# Patient Record
Sex: Male | Born: 1975
Health system: Southern US, Community
[De-identification: ages and names within clinical notes are randomized; demographics above are authoritative.]

## PROBLEM LIST (undated history)

## (undated) DIAGNOSIS — E785 Hyperlipidemia, unspecified: Secondary | ICD-10-CM

## (undated) HISTORY — PX: OTHER SURGICAL HISTORY: SHX169

## (undated) HISTORY — DX: Hyperlipidemia, unspecified: E78.5

---

## 2001-03-14 ENCOUNTER — Emergency Department (HOSPITAL_COMMUNITY): Admission: EM | Admit: 2001-03-14 | Discharge: 2001-03-14 | Payer: Self-pay | Admitting: Emergency Medicine

## 2001-03-14 ENCOUNTER — Encounter: Payer: Self-pay | Admitting: Emergency Medicine

## 2001-06-28 ENCOUNTER — Encounter: Payer: Self-pay | Admitting: Internal Medicine

## 2001-06-28 ENCOUNTER — Ambulatory Visit (HOSPITAL_COMMUNITY): Admission: RE | Admit: 2001-06-28 | Discharge: 2001-06-28 | Payer: Self-pay | Admitting: Internal Medicine

## 2003-03-01 ENCOUNTER — Emergency Department (HOSPITAL_COMMUNITY): Admission: EM | Admit: 2003-03-01 | Discharge: 2003-03-01 | Payer: Self-pay | Admitting: Emergency Medicine

## 2005-08-31 ENCOUNTER — Ambulatory Visit (HOSPITAL_COMMUNITY): Admission: RE | Admit: 2005-08-31 | Discharge: 2005-08-31 | Payer: Self-pay | Admitting: Pulmonary Disease

## 2005-09-08 ENCOUNTER — Emergency Department (HOSPITAL_COMMUNITY): Admission: EM | Admit: 2005-09-08 | Discharge: 2005-09-08 | Payer: Self-pay | Admitting: Emergency Medicine

## 2016-08-08 DIAGNOSIS — N200 Calculus of kidney: Secondary | ICD-10-CM | POA: Diagnosis not present

## 2016-08-08 DIAGNOSIS — R300 Dysuria: Secondary | ICD-10-CM | POA: Diagnosis not present

## 2016-08-19 DIAGNOSIS — H15101 Unspecified episcleritis, right eye: Secondary | ICD-10-CM | POA: Diagnosis not present

## 2016-09-14 DIAGNOSIS — R5383 Other fatigue: Secondary | ICD-10-CM | POA: Diagnosis not present

## 2016-09-14 DIAGNOSIS — J309 Allergic rhinitis, unspecified: Secondary | ICD-10-CM | POA: Diagnosis not present

## 2016-12-29 DIAGNOSIS — G8929 Other chronic pain: Secondary | ICD-10-CM | POA: Insufficient documentation

## 2016-12-29 DIAGNOSIS — M25562 Pain in left knee: Secondary | ICD-10-CM | POA: Diagnosis not present

## 2016-12-29 DIAGNOSIS — M1712 Unilateral primary osteoarthritis, left knee: Secondary | ICD-10-CM | POA: Diagnosis not present

## 2017-01-19 ENCOUNTER — Other Ambulatory Visit: Payer: Self-pay | Admitting: Orthopedic Surgery

## 2017-01-19 DIAGNOSIS — M25562 Pain in left knee: Secondary | ICD-10-CM

## 2017-01-19 DIAGNOSIS — Z77018 Contact with and (suspected) exposure to other hazardous metals: Secondary | ICD-10-CM

## 2017-01-23 ENCOUNTER — Other Ambulatory Visit: Payer: Self-pay | Admitting: Orthopedic Surgery

## 2017-01-23 ENCOUNTER — Ambulatory Visit
Admission: RE | Admit: 2017-01-23 | Discharge: 2017-01-23 | Disposition: A | Discharge status: Home / Self Care | Payer: 59 | Source: Ambulatory Visit | Attending: Orthopedic Surgery | Admitting: Orthopedic Surgery

## 2017-01-23 DIAGNOSIS — Z77018 Contact with and (suspected) exposure to other hazardous metals: Secondary | ICD-10-CM

## 2017-01-23 DIAGNOSIS — Z01818 Encounter for other preprocedural examination: Secondary | ICD-10-CM | POA: Diagnosis not present

## 2017-01-24 ENCOUNTER — Ambulatory Visit
Admission: RE | Admit: 2017-01-24 | Discharge: 2017-01-24 | Disposition: A | Discharge status: Home / Self Care | Payer: 59 | Source: Ambulatory Visit | Attending: Orthopedic Surgery | Admitting: Orthopedic Surgery

## 2017-01-24 DIAGNOSIS — M25562 Pain in left knee: Secondary | ICD-10-CM | POA: Diagnosis not present

## 2017-01-24 DIAGNOSIS — S83232A Complex tear of medial meniscus, current injury, left knee, initial encounter: Secondary | ICD-10-CM | POA: Diagnosis not present

## 2017-01-24 DIAGNOSIS — G8929 Other chronic pain: Secondary | ICD-10-CM | POA: Diagnosis not present

## 2017-01-24 DIAGNOSIS — M1711 Unilateral primary osteoarthritis, right knee: Secondary | ICD-10-CM | POA: Diagnosis not present

## 2017-02-07 DIAGNOSIS — N401 Enlarged prostate with lower urinary tract symptoms: Secondary | ICD-10-CM | POA: Diagnosis not present

## 2017-02-07 DIAGNOSIS — J301 Allergic rhinitis due to pollen: Secondary | ICD-10-CM | POA: Diagnosis not present

## 2017-02-08 DIAGNOSIS — S83222A Peripheral tear of medial meniscus, current injury, left knee, initial encounter: Secondary | ICD-10-CM | POA: Diagnosis not present

## 2017-02-08 DIAGNOSIS — M94262 Chondromalacia, left knee: Secondary | ICD-10-CM | POA: Diagnosis not present

## 2017-02-08 DIAGNOSIS — M659 Synovitis and tenosynovitis, unspecified: Secondary | ICD-10-CM | POA: Diagnosis not present

## 2017-02-14 DIAGNOSIS — Z9889 Other specified postprocedural states: Secondary | ICD-10-CM | POA: Insufficient documentation

## 2017-02-14 DIAGNOSIS — G8929 Other chronic pain: Secondary | ICD-10-CM | POA: Diagnosis not present

## 2017-02-14 DIAGNOSIS — M25662 Stiffness of left knee, not elsewhere classified: Secondary | ICD-10-CM | POA: Diagnosis not present

## 2017-02-14 DIAGNOSIS — M25562 Pain in left knee: Secondary | ICD-10-CM | POA: Diagnosis not present

## 2017-02-20 DIAGNOSIS — Z9889 Other specified postprocedural states: Secondary | ICD-10-CM | POA: Diagnosis not present

## 2017-02-20 DIAGNOSIS — M25562 Pain in left knee: Secondary | ICD-10-CM | POA: Diagnosis not present

## 2017-02-20 DIAGNOSIS — M25662 Stiffness of left knee, not elsewhere classified: Secondary | ICD-10-CM | POA: Diagnosis not present

## 2017-08-14 DIAGNOSIS — N401 Enlarged prostate with lower urinary tract symptoms: Secondary | ICD-10-CM | POA: Diagnosis not present

## 2017-08-14 DIAGNOSIS — J301 Allergic rhinitis due to pollen: Secondary | ICD-10-CM | POA: Diagnosis not present

## 2018-02-14 DIAGNOSIS — E785 Hyperlipidemia, unspecified: Secondary | ICD-10-CM | POA: Diagnosis not present

## 2018-02-14 DIAGNOSIS — J301 Allergic rhinitis due to pollen: Secondary | ICD-10-CM | POA: Diagnosis not present

## 2018-02-20 DIAGNOSIS — E785 Hyperlipidemia, unspecified: Secondary | ICD-10-CM | POA: Diagnosis not present

## 2018-02-20 DIAGNOSIS — J301 Allergic rhinitis due to pollen: Secondary | ICD-10-CM | POA: Diagnosis not present

## 2018-03-28 DIAGNOSIS — Z23 Encounter for immunization: Secondary | ICD-10-CM | POA: Diagnosis not present

## 2018-10-02 DIAGNOSIS — M7712 Lateral epicondylitis, left elbow: Secondary | ICD-10-CM | POA: Diagnosis not present

## 2018-10-10 DIAGNOSIS — J301 Allergic rhinitis due to pollen: Secondary | ICD-10-CM | POA: Diagnosis not present

## 2019-01-04 ENCOUNTER — Encounter: Payer: Self-pay | Admitting: Podiatry

## 2019-01-04 ENCOUNTER — Ambulatory Visit: Payer: 59 | Admitting: Podiatry

## 2019-01-04 ENCOUNTER — Other Ambulatory Visit: Payer: Self-pay

## 2019-01-04 ENCOUNTER — Ambulatory Visit (INDEPENDENT_AMBULATORY_CARE_PROVIDER_SITE_OTHER): Payer: 59

## 2019-01-04 VITALS — BP 139/84 | HR 73 | Temp 97.9°F | Resp 16

## 2019-01-04 DIAGNOSIS — M722 Plantar fascial fibromatosis: Secondary | ICD-10-CM

## 2019-01-04 DIAGNOSIS — L600 Ingrowing nail: Secondary | ICD-10-CM

## 2019-01-04 MED ORDER — NEOMYCIN-POLYMYXIN-HC 3.5-10000-1 OT SOLN: OTIC | 1 refills | Status: DC

## 2019-01-04 NOTE — Patient Instructions (Signed)
Soak Instructions    THE DAY AFTER THE PROCEDURE  Place 1/4 cup of epsom salts in a quart of warm tap water.  Submerge your foot or feet with outer bandage intact for the initial soak; this will allow the bandage to become moist and wet for easy lift off.  Once you remove your bandage, continue to soak in the solution for 20 minutes.  This soak should be done twice a day.  Next, remove your foot or feet from solution, blot dry the affected area and cover.  You may use a band aid large enough to cover the area or use gauze and tape.  Apply other medications to the area as directed by the doctor such as polysporin neosporin.  IF YOUR SKIN BECOMES IRRITATED WHILE USING THESE INSTRUCTIONS, IT IS OKAY TO SWITCH TO  WHITE VINEGAR AND WATER. Or you may use antibacterial soap and water to keep the toe clean  Monitor for any signs/symptoms of infection. Call the office immediately if any occur or go directly to the emergency room. Call with any questions/concerns.  [facility] Instructions-Post Nail Surgery  You have had your ingrown toenail and root treated with a chemical.  This chemical causes a burn that will drain and ooze like a blister.  This can drain for 6-8 weeks or longer.  It is important to keep this area clean, covered, and follow the soaking instructions dispensed at the time of your surgery.  This area will eventually dry and form a scab.  Once the scab forms you no longer need to soak or apply a dressing.  If at any time you experience an increase in pain, redness, swelling, or drainage, you should contact the office as soon as possible.   For instructions on how to put on your Plantar Fascial Brace, please visit [email or web address]    Plantar Fasciitis (Heel Spur Syndrome) with Rehab The plantar fascia is a fibrous, ligament-like, soft-tissue structure that spans the bottom of the foot. Plantar fasciitis is a condition that causes pain in the foot due to inflammation of the  tissue. SYMPTOMS   Pain and tenderness on the underneath side of the foot.  Pain that worsens with standing or walking. CAUSES  Plantar fasciitis is caused by irritation and injury to the plantar fascia on the underneath side of the foot. Common mechanisms of injury include:  Direct trauma to bottom of the foot.  Damage to a small nerve that runs under the foot where the main fascia attaches to the heel bone.  Stress placed on the plantar fascia due to bone spurs. RISK INCREASES WITH:   Activities that place stress on the plantar fascia (running, jumping, pivoting, or cutting).  Poor strength and flexibility.  Improperly fitted shoes.  Tight calf muscles.  Flat feet.  Failure to warm-up properly before activity.  Obesity. PREVENTION  Warm up and stretch properly before activity.  Allow for adequate recovery between workouts.  Maintain physical fitness:  Strength, flexibility, and endurance.  Cardiovascular fitness.  Maintain a health body weight.  Avoid stress on the plantar fascia.  Wear properly fitted shoes, including arch supports for individuals who have flat feet.  PROGNOSIS  If treated properly, then the symptoms of plantar fasciitis usually resolve without surgery. However, occasionally surgery is necessary.  RELATED COMPLICATIONS   Recurrent symptoms that may result in a chronic condition.  Problems of the lower back that are caused by compensating for the injury, such as limping.  Pain or weakness of  the foot during push-off following surgery.  Chronic inflammation, scarring, and partial or complete fascia tear, occurring more often from repeated injections.  TREATMENT  Treatment initially involves the use of ice and medication to help reduce pain and inflammation. The use of strengthening and stretching exercises may help reduce pain with activity, especially stretches of the Achilles tendon. These exercises may be performed at home or with a  therapist. Your caregiver may recommend that you use heel cups of arch supports to help reduce stress on the plantar fascia. Occasionally, corticosteroid injections are given to reduce inflammation. If symptoms persist for greater than 6 months despite non-surgical (conservative), then surgery may be recommended.   MEDICATION   If pain medication is necessary, then nonsteroidal anti-inflammatory medications, such as aspirin and ibuprofen, or other minor pain relievers, such as acetaminophen, are often recommended.  Do not take pain medication within 7 days before surgery.  Prescription pain relievers may be given if deemed necessary by your caregiver. Use only as directed and only as much as you need.  Corticosteroid injections may be given by your caregiver. These injections should be reserved for the most serious cases, because they may only be given a certain number of times.  HEAT AND COLD  Cold treatment (icing) relieves pain and reduces inflammation. Cold treatment should be applied for 10 to 15 minutes every 2 to 3 hours for inflammation and pain and immediately after any activity that aggravates your symptoms. Use ice packs or massage the area with a piece of ice (ice massage).  Heat treatment may be used prior to performing the stretching and strengthening activities prescribed by your caregiver, physical therapist, or athletic trainer. Use a heat pack or soak the injury in warm water.  SEEK IMMEDIATE MEDICAL CARE IF:  Treatment seems to offer no benefit, or the condition worsens.  Any medications produce adverse side effects.  EXERCISES- RANGE OF MOTION (ROM) AND STRETCHING EXERCISES - Plantar Fasciitis (Heel Spur Syndrome) These exercises may help you when beginning to rehabilitate your injury. Your symptoms may resolve with or without further involvement from your physician, physical therapist or athletic trainer. While completing these exercises, remember:   Restoring tissue  flexibility helps normal motion to return to the joints. This allows healthier, less painful movement and activity.  An effective stretch should be held for at least 30 seconds.  A stretch should never be painful. You should only feel a gentle lengthening or release in the stretched tissue.  RANGE OF MOTION - Toe Extension, Flexion  Sit with your right / left leg crossed over your opposite knee.  Grasp your toes and gently pull them back toward the top of your foot. You should feel a stretch on the bottom of your toes and/or foot.  Hold this stretch for 10 seconds.  Now, gently pull your toes toward the bottom of your foot. You should feel a stretch on the top of your toes and or foot.  Hold this stretch for 10 seconds. Repeat  times. Complete this stretch 3 times per day.   RANGE OF MOTION - Ankle Dorsiflexion, Active Assisted  Remove shoes and sit on a chair that is preferably not on a carpeted surface.  Place right / left foot under knee. Extend your opposite leg for support.  Keeping your heel down, slide your right / left foot back toward the chair until you feel a stretch at your ankle or calf. If you do not feel a stretch, slide your bottom forward  to the edge of the chair, while still keeping your heel down.  Hold this stretch for 10 seconds. Repeat 3 times. Complete this stretch 2 times per day.   STRETCH  Gastroc, Standing  Place hands on wall.  Extend right / left leg, keeping the front knee somewhat bent.  Slightly point your toes inward on your back foot.  Keeping your right / left heel on the floor and your knee straight, shift your weight toward the wall, not allowing your back to arch.  You should feel a gentle stretch in the right / left calf. Hold this position for 10 seconds. Repeat 3 times. Complete this stretch 2 times per day.  STRETCH  Soleus, Standing  Place hands on wall.  Extend right / left leg, keeping the other knee somewhat bent.   Slightly point your toes inward on your back foot.  Keep your right / left heel on the floor, bend your back knee, and slightly shift your weight over the back leg so that you feel a gentle stretch deep in your back calf.  Hold this position for 10 seconds. Repeat 3 times. Complete this stretch 2 times per day.  STRETCH  Gastrocsoleus, Standing  Note: This exercise can place a lot of stress on your foot and ankle. Please complete this exercise only if specifically instructed by your caregiver.   Place the ball of your right / left foot on a step, keeping your other foot firmly on the same step.  Hold on to the wall or a rail for balance.  Slowly lift your other foot, allowing your body weight to press your heel down over the edge of the step.  You should feel a stretch in your right / left calf.  Hold this position for 10 seconds.  Repeat this exercise with a slight bend in your right / left knee. Repeat 3 times. Complete this stretch 2 times per day.   STRENGTHENING EXERCISES - Plantar Fasciitis (Heel Spur Syndrome)  These exercises may help you when beginning to rehabilitate your injury. They may resolve your symptoms with or without further involvement from your physician, physical therapist or athletic trainer. While completing these exercises, remember:   Muscles can gain both the endurance and the strength needed for everyday activities through controlled exercises.  Complete these exercises as instructed by your physician, physical therapist or athletic trainer. Progress the resistance and repetitions only as guided.  STRENGTH - Towel Curls  Sit in a chair positioned on a non-carpeted surface.  Place your foot on a towel, keeping your heel on the floor.  Pull the towel toward your heel by only curling your toes. Keep your heel on the floor. Repeat 3 times. Complete this exercise 2 times per day.  STRENGTH - Ankle Inversion  Secure one end of a rubber exercise  band/tubing to a fixed object (table, pole). Loop the other end around your foot just before your toes.  Place your fists between your knees. This will focus your strengthening at your ankle.  Slowly, pull your big toe up and in, making sure the band/tubing is positioned to resist the entire motion.  Hold this position for 10 seconds.  Have your muscles resist the band/tubing as it slowly pulls your foot back to the starting position. Repeat 3 times. Complete this exercises 2 times per day.  Document Released: 05/16/2005 Document Revised: 08/08/2011 Document Reviewed: 08/28/2008 [facility] Patient Information 2014 [place], [place].

## 2019-01-04 NOTE — Progress Notes (Signed)
Subjective:   Patient ID: Raymond [name], male   DOB: 43 y.o.   MRN: [id number]   HPI Patient presents stating he has chronic ingrown toenails of both big toes and a lot of pain in the right bottom of his heel of approximate 1 month duration with no history of injury.  States the nails have bothered him for a long time is tried to trim and soak them without relief of symptoms and patient does not smoke and likes to be active   Review of Systems  All other systems reviewed and are negative.       Objective:  Physical Exam Vitals signs and nursing note reviewed.  Constitutional:      Appearance: He is well-developed.  Pulmonary:     Effort: Pulmonary effort is normal.  Musculoskeletal: Normal range of motion.  Skin:    General: Skin is warm.  Neurological:     Mental Status: He is alert.     Neurovascular status found to be intact muscle strength was found to be adequate range of motion within normal limits.  Patient is found to have incurvated lateral borders of the big toes bilateral that are painful with slight redness noted but no active drainage noted.  Patient is found to have good digital perfusion and is well oriented x3 and has exquisite discomfort in the plantar aspect of the right heel at the insertional point tendon into the calcaneus     Assessment:  Chronic ingrown toenail deformity hallux bilateral with structural changes of the nailbed but no indication of infection along with acute plantar fasciitis right     [name]:  H&P condition reviewed and recommended correction of the ingrown toenail deformities.  I explained procedure risk and today I infiltrated each hallux 60 mg Xylocaine Marcaine mixture I remove the lateral borders exposed matrix and applied phenol 3 applications 30 seconds followed by alcohol lavage and sterile dressing.  Gave instructions on soaks and to leave dressings on 24 hours but take them off earlier if throbbing were to occur and wrote  prescription for drops and encouraged to call with questions.  I then did sterile prep and injected the plantar fascial right 3 mg Kenalog 5 mg Xylocaine and instructed on physical therapy shoe gear modification and reappoint 2 weeks to reevaluate  X-rays indicate no spur formation mild depression of the arch with no indication of arthritis stress fracture

## 2019-01-04 NOTE — Progress Notes (Signed)
   Subjective:    Patient ID: Raymond [name], male    DOB: [birth year], 43 y.o.   MRN: [id number]  HPI    Review of Systems  All other systems reviewed and are negative.      Objective:   Physical Exam        Assessment & Plan:

## 2019-01-18 ENCOUNTER — Other Ambulatory Visit: Payer: Self-pay

## 2019-01-18 ENCOUNTER — Ambulatory Visit (INDEPENDENT_AMBULATORY_CARE_PROVIDER_SITE_OTHER): Payer: 59 | Admitting: Podiatry

## 2019-01-18 ENCOUNTER — Encounter: Payer: Self-pay | Admitting: Podiatry

## 2019-01-18 VITALS — Temp 97.4°F

## 2019-01-18 DIAGNOSIS — M722 Plantar fascial fibromatosis: Secondary | ICD-10-CM

## 2019-01-18 DIAGNOSIS — L600 Ingrowing nail: Secondary | ICD-10-CM

## 2019-01-18 MED ORDER — DICLOFENAC SODIUM 75 MG PO TBEC: 75.0000 mg | DELAYED_RELEASE_TABLET | Freq: Two times a day (BID) | ORAL | 2 refills | Status: DC

## 2019-01-18 NOTE — Progress Notes (Signed)
Subjective:   Patient ID: Raymond [name], male   DOB: 43 y.o.   MRN: [id number]   HPI Patient states that he is moderately improved but he still is having some discomfort in the heel right and states the ingrown's are healed well.     ROS      Objective:  Physical Exam  Neurovascular status intact muscle strength was found to be adequate with right heel quite sore in the medial band but still noted but some improvement in the ingrown is healed well at the current time     Assessment:  Plantar fasciitis still present right along with ingrown's that have healed well     [name]:  H&P conditions reviewed at today sterile prep and injected the fascia right 3 mg Kenalog 5 mg Xylocaine and advised on anti-inflammatories supportive shoe.  Patient will be seen back to recheck

## 2019-04-02 IMAGING — MR MR KNEE*L* W/O CM
4 of 5 series · 30 of 40 positions shown · non-contrast
Comparison: None.

CLINICAL DATA: Left knee pain for 2 months.  No known injury.

EXAM:
MRI OF THE LEFT KNEE WITHOUT CONTRAST
TECHNIQUE: Multiplanar, multisequence MR imaging of the knee was performed. No
intravenous contrast was administered.

[Series 6: PD fat-sat · axial · left · 3.0mm · 0.39mm/px · z∈[-28,+93]mm · 9 of 38 slices shown (1 of 3)]
[im 1/38]
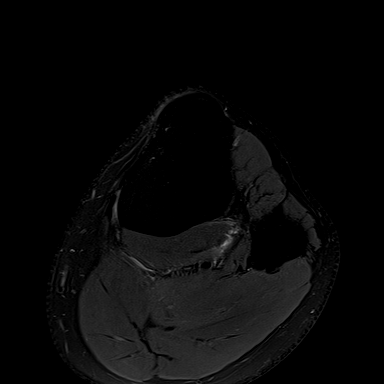
[im 5/38]
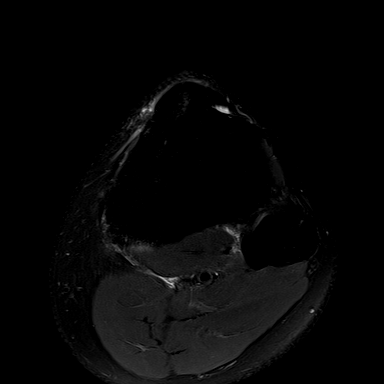
[im 10/38]
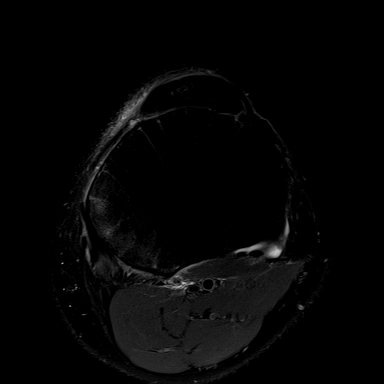
[im 14/38]
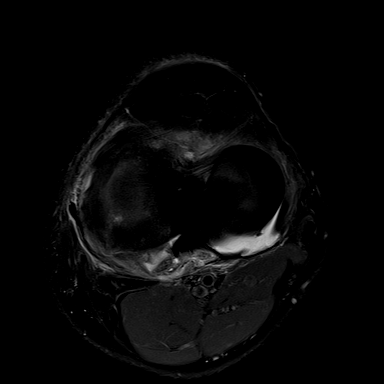
[im 19/38]
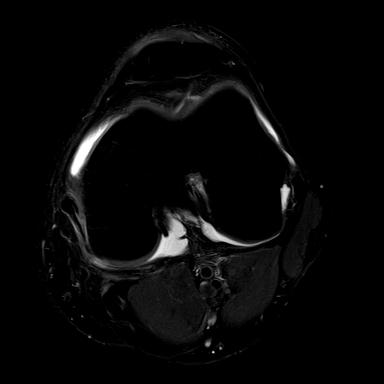
[im 24/38]
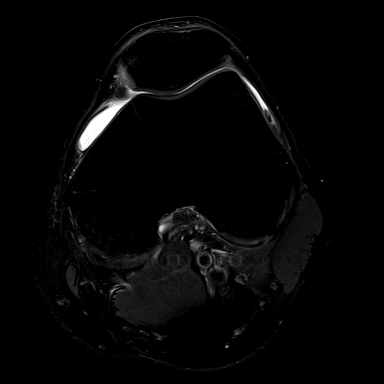
[im 28/38]
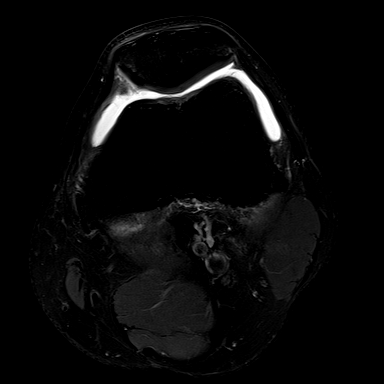
[im 33/38]
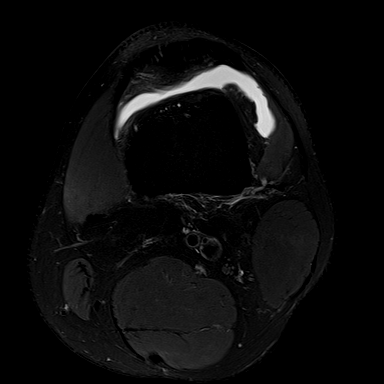
[im 38/38]
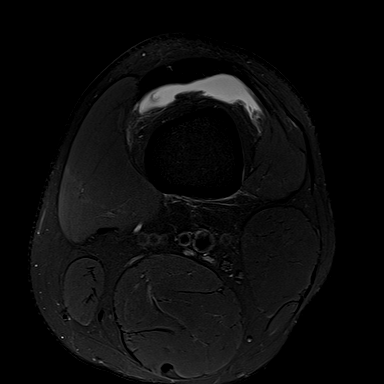

[Series 8: PD fat-sat · coronal · left · 3.0mm · 0.33mm/px · 8 of 35 slices shown (2 of 3)]
[im 1/35]
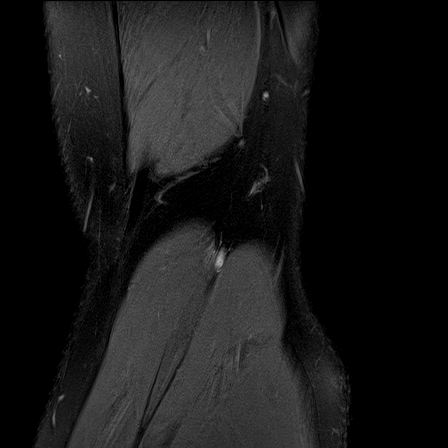
[im 5/35]
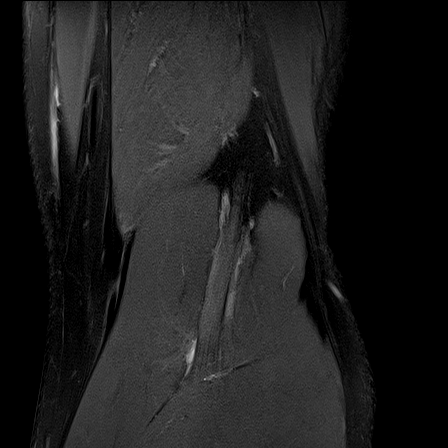
[im 10/35]
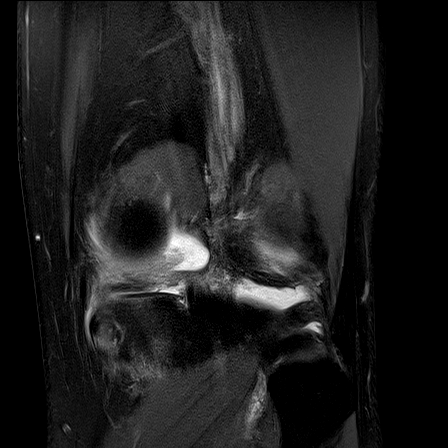
[im 15/35]
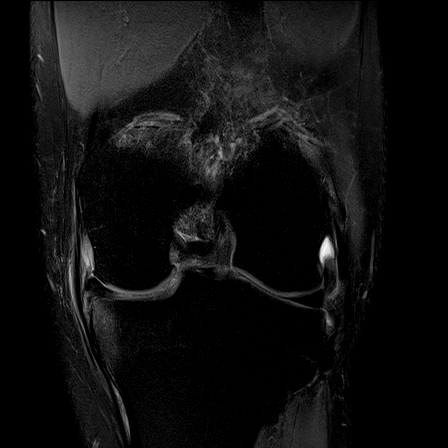
[im 20/35]
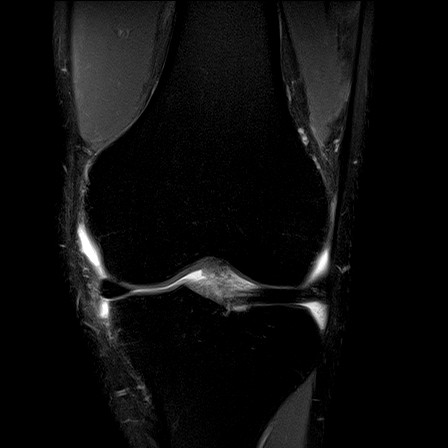
[im 25/35]
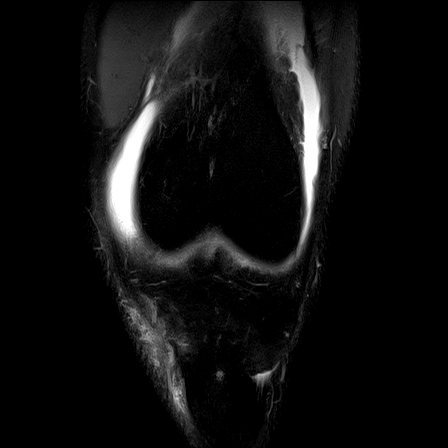
[im 30/35]
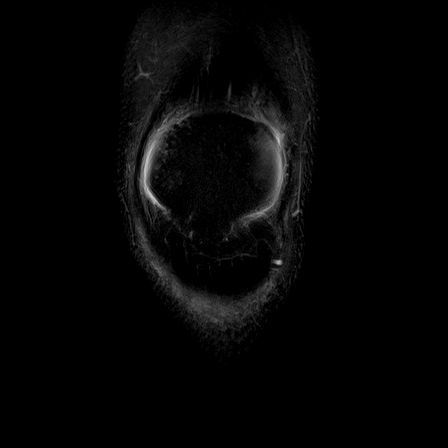
[im 35/35]
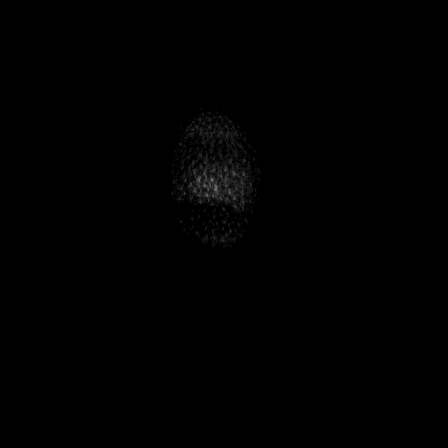

[Series 9: PD fat-sat · sagittal · left · 3.0mm · 0.39mm/px · 7 of 29 slices shown (3 of 3)]
[im 1/29]
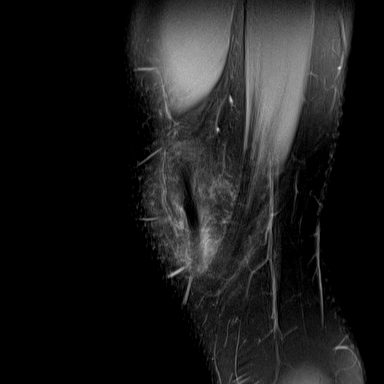
[im 5/29]
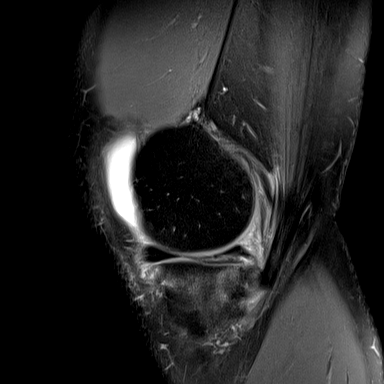
[im 10/29]
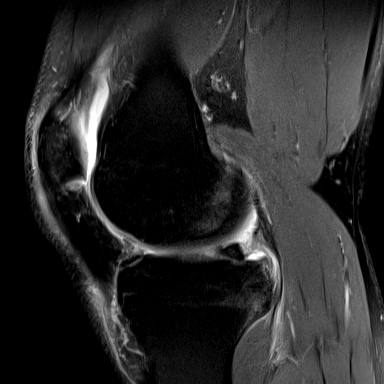
[im 15/29]
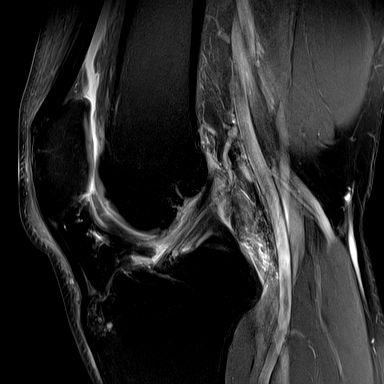
[im 19/29]
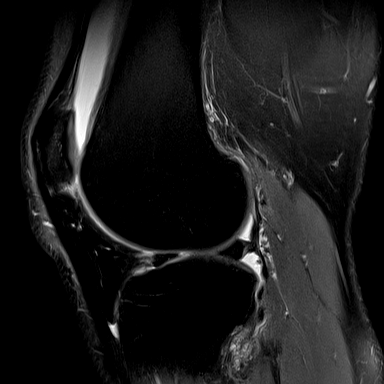
[im 24/29]
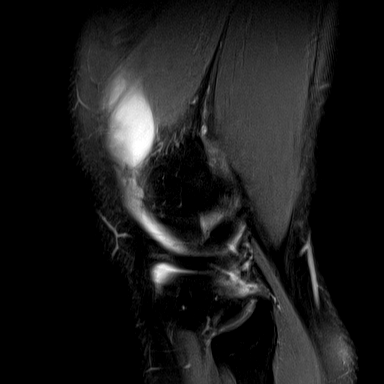
[im 29/29]
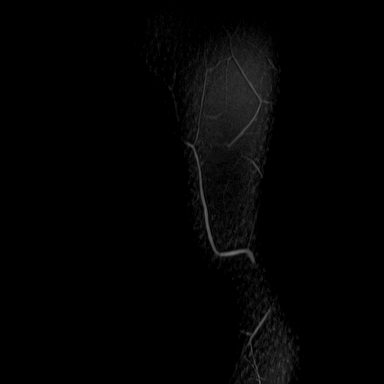

[Series 10: T2 fat-sat · coronal · left · 3.0mm · 0.39mm/px · 6 of 35 slices shown]
[im 1/35]
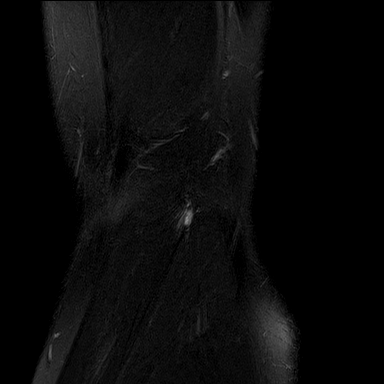
[im 5/35]
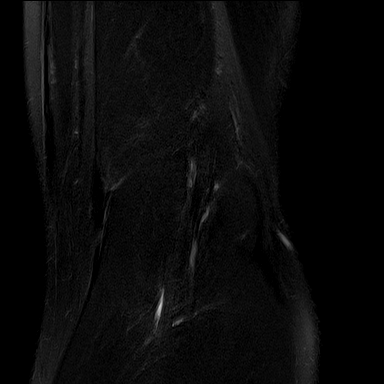
[im 10/35]
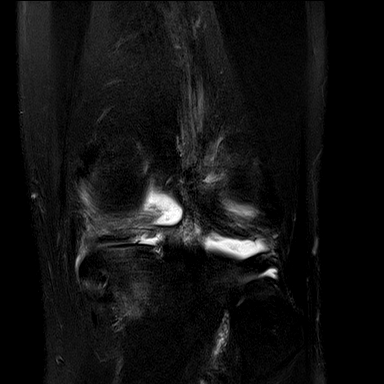
[im 15/35]
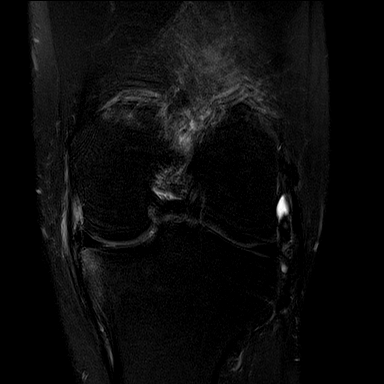
[im 20/35]
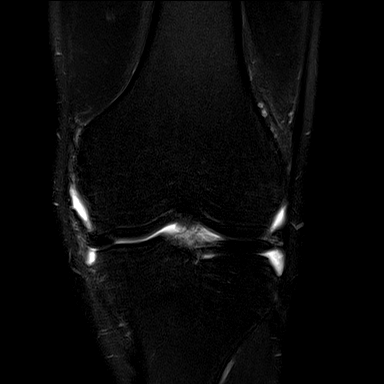
[im 30/35]
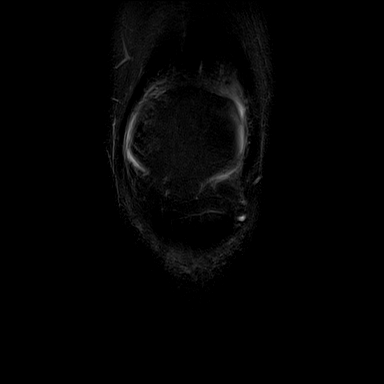

[30 of 40 positions shown; findings below may reference images not displayed]

FINDINGS: MENISCI

Medial meniscus: There is a predominantly horizontal tear of the
body and posterior horn of the medial meniscus with a small radial
component of the posterior horn. There is a 8 mm parameniscal cyst
posterior to the posterior horn. The posterior root is intact.

Lateral meniscus:  Intact and normal morphology.

LIGAMENTS

Cruciates:  Intact ACL and PCL.

Collaterals: Mild edema tracks adjacent to the MCL. There is
thickening and increased intermediate signal of the meniscofemoral
and meniscotibial ligaments. The lateral collateral ligament complex
is intact.

CARTILAGE

Patellofemoral: Focal full-thickness fissuring over the medial
patellar facet. Partial-thickness cartilage loss with areas of
adjacent delamination and full-thickness fissuring in the deep
trochlear groove.

Medial: Thinning of the cartilage overlying the peripheral aspect of
the medial tibial plateau.

Lateral: Minimal superficial irregularity over the posterior
nonweightbearing lateral femoral condyle. Otherwise intact.

Joint: Moderate joint effusion. Small amount of edema in Hoffa's fat
pad. No plical thickening.

Popliteal Fossa:  No Baker cyst. Intact popliteus tendon.

Extensor Mechanism: Mild increased intermediate signal within the
patellar tendon. Sequelae of prior Osgood-Schlatter disease.

Bones: Degenerative marrow edema within the peripheral medial tibial
plateau.

Other: None.
IMPRESSION: 1. Predominantly horizontal tear of the medial meniscus body and
posterior horn with a small radial component of the posterior horn.
There is an associated 8 mm parameniscal cyst posterior to the
posterior horn.
2. Attenuation and increased signal of the deep MCL fibers, which
could be reactive given adjacent meniscal tear, however,
sprain/partial tearing could have a similar appearance.
3. Mild edema tracks adjacent to the MCL. This can be incidental but
in the appropriate clinical circumstance could represent grade 1
sprain.
4. Mild tricompartmental cartilage abnormalities as described above,
worst in the patellofemoral compartment.
5. Moderate joint effusion.

## 2019-08-02 ENCOUNTER — Ambulatory Visit: Payer: 59 | Attending: Internal Medicine

## 2019-08-02 DIAGNOSIS — Z23 Encounter for immunization: Secondary | ICD-10-CM | POA: Insufficient documentation

## 2019-08-02 NOTE — Progress Notes (Signed)
   Covid-19 Vaccination Clinic  Name:  ZEPLIN ALESHIRE    MRN: [id number] DOB: [birth year]  08/02/2019  Mr. Eline was observed post Covid-19 immunization for 15 minutes without incident. He was provided with Vaccine Information Sheet and instruction to access the V-Safe system.   Mr. Derasmo was instructed to call 911 with any severe reactions post vaccine: Marland Kitchen Difficulty breathing  . Swelling of face and throat  . A fast heartbeat  . A bad rash all over body  . Dizziness and weakness   Immunizations Administered    Name Date Dose VIS Date Route   Pfizer COVID-19 Vaccine 08/02/2019 12:27 PM 0.3 mL 05/10/2019 Intramuscular   Manufacturer: [employer]   Lot: [id number]   [place]: [address]-3

## 2019-09-03 ENCOUNTER — Ambulatory Visit: Payer: 59 | Attending: Internal Medicine

## 2019-09-03 DIAGNOSIS — Z23 Encounter for immunization: Secondary | ICD-10-CM

## 2019-09-03 NOTE — Progress Notes (Signed)
   Covid-19 Vaccination Clinic  Name:  JOSEL KEO    MRN: [id number] DOB: 1976-05-25  09/03/2019  Mr. Smedberg was observed post Covid-19 immunization for 15 minutes without incident. He was provided with Vaccine Information Sheet and instruction to access the V-Safe system.   Mr. Wickliff was instructed to call 911 with any severe reactions post vaccine: Marland Kitchen Difficulty breathing  . Swelling of face and throat  . A fast heartbeat  . A bad rash all over body  . Dizziness and weakness   Immunizations Administered    Name Date Dose VIS Date Route   Pfizer COVID-19 Vaccine 09/03/2019  1:54 PM 0.3 mL 05/10/2019 Intramuscular   Manufacturer: [employer]   Lot: [id number]   [place]: [address]-8

## 2019-10-25 ENCOUNTER — Ambulatory Visit: Payer: 59 | Admitting: Family Medicine

## 2019-10-25 ENCOUNTER — Encounter: Payer: Self-pay | Admitting: Family Medicine

## 2019-10-25 ENCOUNTER — Other Ambulatory Visit: Payer: Self-pay

## 2019-10-25 ENCOUNTER — Ambulatory Visit: Payer: Self-pay

## 2019-10-25 DIAGNOSIS — M25521 Pain in right elbow: Secondary | ICD-10-CM

## 2019-10-25 NOTE — Progress Notes (Signed)
   Office Visit Note   Patient: Raymond [name]           Date of Birth: 1975-08-15           MRN: [id number] Visit Date: 10/25/2019 Requested by: No referring provider defined for this encounter. PCP: Patient, No Pcp Per  Subjective: Chief Complaint  Patient presents with  . Right Elbow - Pain    HPI: He is here at the request of Raymond [name] for right elbow pain.  He is right-hand dominant.  He has had intermittent problems with his elbow for years, usually settles down with a wrist brace and rest.  This time a couple months ago it started hurting and he has been going to physical therapy but it does not seem to be responding.  Pain on the lateral elbow, pain with any repetitive duties.  No significant numbness or tingling in his hand.  He has tried over-the-counter anti-inflammatories.              ROS: He is otherwise in good health.  All other systems were reviewed and are negative.  Objective: Vital Signs: There were no vitals taken for this visit.  Physical Exam:  General:  Alert and oriented, in no acute distress. Pulm:  Breathing unlabored. Psy:  Normal mood, congruent affect. Skin: No rash or erythema Right elbow: Full active range of motion, pain at the extreme of extension.  Point tender at the common extensor tendon at the lateral epicondyle.  Mild tenderness at the radial tunnel.  Pain with wrist extension and third finger extension against resistance, also with forearm pronation and supination.  Imaging: US Guided Needle Placement  Result Date: 10/25/2019 Limited diagnostic ultrasound right elbow: He has common extensor tendinopathy with deep interstitial partial tear.  There is some hyperemia with power Doppler imaging.   Assessment & Plan: 1.  Right elbow lateral epicondylitis with partial tear -Discussed options with him, elected to inject with cortisone today.  If he fails to get long-term relief, could try dextrose prolotherapy versus  PRP.     Procedures: Right elbow injection: After sterile prep with Betadine, injected 3 cc 1% lidocaine without epinephrine and 40 mg methylprednisolone into the area of maximum tenderness at the common extensor tendon.    PMFS History: Patient Active Problem List   Diagnosis Date Noted  . Chronic pain of left knee 12/29/2016   History reviewed. No pertinent past medical history.  History reviewed. No pertinent family history.  History reviewed. No pertinent surgical history. Social History   Occupational History  . Not on file  Tobacco Use  . Smoking status: Never Smoker  . Smokeless tobacco: Never Used  Substance and Sexual Activity  . Alcohol use: Not Currently  . Drug use: Not on file  . Sexual activity: Not on file

## 2019-10-25 NOTE — Progress Notes (Signed)
Right elbow pain since first of the year Tennis elbow brace and naproxen helped some but never went away No injury to elbow

## 2019-11-20 ENCOUNTER — Ambulatory Visit: Payer: 59 | Admitting: Family Medicine

## 2019-11-20 ENCOUNTER — Ambulatory Visit: Payer: Self-pay

## 2019-11-20 ENCOUNTER — Other Ambulatory Visit: Payer: Self-pay

## 2019-11-20 ENCOUNTER — Encounter: Payer: Self-pay | Admitting: Family Medicine

## 2019-11-20 DIAGNOSIS — M25521 Pain in right elbow: Secondary | ICD-10-CM | POA: Diagnosis not present

## 2019-11-20 NOTE — Progress Notes (Signed)
   Office Visit Note   Patient: Raymond [name]           Date of Birth: [birth year]           MRN: [id number] Visit Date: 11/20/2019 Requested by: No referring provider defined for this encounter. PCP: Patient, No Pcp Per  Subjective: Chief Complaint  Patient presents with  . Right Elbow - Pain    Had 2 weeks' relief with the cortisone injection. Pain returned with use this past week.     HPI: He is here with recurrent right level pain.  Tennis elbow injection helped for about 2 weeks, not complete relief but he felt better.  Then he use his arm a lot and it flared it up again.  Pain mostly on the lateral aspect but some on the medial.  No numbness or tingling, no weakness.              ROS:   All other systems were reviewed and are negative.  Objective: Vital Signs: There were no vitals taken for this visit.  Physical Exam:  General:  Alert and oriented, in no acute distress. Pulm:  Breathing unlabored. Psy:  Normal mood, congruent affect. Skin: No erythema or rash Right elbow: Full active range of motion, no effusion.  He is tender at the common extensor tendon at the lateral epicondyle.  He has tenderness at the radial tunnel as well.  There is a little bit of tenderness over the medial epicondyle.  Imaging: US Guided Needle Placement - No Linked Charges  Result Date: 11/20/2019 Right elbow radial tunnel injected under ultrasound guidance.   Assessment & Plan: 1.  Persistent chronic right elbow pain, suspect lateral epicondylitis but cannot rule out radial tunnel syndrome. -Elected to inject the radial tunnel today.  I will also inject the lateral epicondyle with dextrose and lidocaine. -If symptoms persist he will come back in a few weeks for another dextrose prolotherapy injection at the lateral epicondyle.  If he fails to improve with that, could contemplate PRP prior to surgical consult.     Procedures: Right elbow injections: After sterile prep with Betadine,  injected 2 cc 1% lidocaine without epinephrine and 20 mg methylprednisolone into the region of the radial tunnel.  He had slight weakness with wrist extension afterward, but that improved after about 20 minutes.  Also injected 3 cc 1% lidocaine without epinephrine and 2 cc 50% dextrose into the common extensor tendon.    PMFS History: Patient Active Problem List   Diagnosis Date Noted  . Chronic pain of left knee 12/29/2016   History reviewed. No pertinent past medical history.  History reviewed. No pertinent family history.  History reviewed. No pertinent surgical history. Social History   Occupational History  . Not on file  Tobacco Use  . Smoking status: Never Smoker  . Smokeless tobacco: Never Used  Substance and Sexual Activity  . Alcohol use: Not Currently  . Drug use: Not on file  . Sexual activity: Not on file

## 2019-12-27 ENCOUNTER — Other Ambulatory Visit: Payer: Self-pay

## 2019-12-27 ENCOUNTER — Encounter: Payer: Self-pay | Admitting: Family Medicine

## 2019-12-27 ENCOUNTER — Ambulatory Visit: Payer: 59 | Admitting: Family Medicine

## 2019-12-27 ENCOUNTER — Ambulatory Visit: Payer: 59

## 2019-12-27 DIAGNOSIS — M25521 Pain in right elbow: Secondary | ICD-10-CM | POA: Diagnosis not present

## 2019-12-27 MED ORDER — NITROGLYCERIN 0.1 MG/HR TD PT24: MEDICATED_PATCH | TRANSDERMAL | 3 refills | Status: DC

## 2019-12-27 NOTE — Progress Notes (Signed)
   Office Visit Note   Patient: Raymond [name]           Date of Birth: [birth year]           MRN: [id number] Visit Date: 12/27/2019 Requested by: No referring provider defined for this encounter. PCP: Patient, No Pcp Per  Subjective: Chief Complaint  Patient presents with  . Right Elbow - Pain    HPI: He is here for follow-up chronic right elbow lateral epicondylitis.  First injection was with cortisone, really did not help much.  Last time we did dextrose prolotherapy as well as a radial tunnel injection.  This has not helped much either.  Continues to have pain on the lateral elbow with use of his arm, difficult to fully extend his elbow.  Denies any numbness or tingling in his arm.              ROS:   All other systems were reviewed and are negative.  Objective: Vital Signs: There were no vitals taken for this visit.  Physical Exam:  General:  Alert and oriented, in no acute distress. Pulm:  Breathing unlabored. Psy:  Normal mood, congruent affect.  Right elbow: He has tenderness at the common extensor tendon at the lateral epicondyle.  He is also still tender at the radial tunnel.  He has pain with wrist extension and third finger extension as well as forearm pronation and supination.  Imaging: US Guided Needle Placement  Result Date: 12/27/2019 Limited diagnostic ultrasound reveals continued hypoechoic changes in the deep fibers of the common extensor tendon with hyperemia on power Doppler imaging.   Assessment & Plan: 1.  Chronic right elbow lateral epicondylitis -Discussed options with him and elected to try nitroglycerin patch therapy combined with wrist brace.  If he fails to improve, he wants to try PRP next.  He will call to coordinate that.     Procedures: No procedures performed  No notes on file     PMFS History: Patient Active Problem List   Diagnosis Date Noted  . Chronic pain of left knee 12/29/2016   History reviewed. No pertinent past medical  history.  History reviewed. No pertinent family history.  History reviewed. No pertinent surgical history. Social History   Occupational History  . Not on file  Tobacco Use  . Smoking status: Never Smoker  . Smokeless tobacco: Never Used  Substance and Sexual Activity  . Alcohol use: Not Currently  . Drug use: Not on file  . Sexual activity: Not on file

## 2020-03-10 ENCOUNTER — Ambulatory Visit: Payer: 59 | Admitting: Family Medicine

## 2020-03-10 ENCOUNTER — Encounter: Payer: Self-pay | Admitting: Family Medicine

## 2020-03-10 ENCOUNTER — Other Ambulatory Visit: Payer: Self-pay

## 2020-03-10 DIAGNOSIS — M25521 Pain in right elbow: Secondary | ICD-10-CM | POA: Diagnosis not present

## 2020-03-10 NOTE — Progress Notes (Signed)
   Office Visit Note   Patient: Raymond [name]           Date of Birth: 1975/08/05           MRN: [id number] Visit Date: 03/10/2020 Requested by: No referring provider defined for this encounter. PCP: Patient, No Pcp Per  Subjective: Chief Complaint  Patient presents with  . Right Elbow - Pain    The Ntg patches did not help the elbow -- just gave him a headache. Now, the medial elbow is also tender to touch.     HPI: He is here for follow-up chronic right elbow pain.  He did not tolerate nitroglycerin patches, he developed a headache.  Cortisone injection did not help much.  Dextrose injection did not help, nor did radial tunnel injection.  Physical therapy dry needling also did not eliminate his pain.  Now is starting to have some medial elbow pain as well.  He admits that he is very active, always doing something physically demanding.  He thinks that is part of his lack of healing.               ROS:   All other systems were reviewed and are negative.  Objective: Vital Signs: There were no vitals taken for this visit.  Physical Exam:  General:  Alert and oriented, in no acute distress. Pulm:  Breathing unlabored. Psy:  Normal mood, congruent affect.  Right elbow: Full range of motion, no effusion.  Tender at the common extensor tendon at the lateral epicondyle.  Slight tenderness in the radial tunnel, slight tenderness over the medial epicondyle.  Maximum pain is with wrist extension and third finger extension against resistance.  Imaging: No results found.  Assessment & Plan: 1.  Chronic right elbow lateral epicondylitis -Discussed options with him, he wants to try PRP.  He wants to schedule this approximately the first week of November.  We will then rest the elbow as much as possible for 2 weeks afterward, and avoid the use of NSAIDs.  We could follow the PRP with dextrose injections if needed.     Procedures: No procedures performed  No notes on file     PMFS  History: Patient Active Problem List   Diagnosis Date Noted  . S/P left knee arthroscopy 02/14/2017  . Chronic pain of left knee 12/29/2016   History reviewed. No pertinent past medical history.  History reviewed. No pertinent family history.  History reviewed. No pertinent surgical history. Social History   Occupational History  . Not on file  Tobacco Use  . Smoking status: Never Smoker  . Smokeless tobacco: Never Used  Substance and Sexual Activity  . Alcohol use: Not Currently  . Drug use: Not on file  . Sexual activity: Not on file

## 2020-03-27 ENCOUNTER — Ambulatory Visit: Payer: Self-pay

## 2020-03-27 ENCOUNTER — Other Ambulatory Visit: Payer: Self-pay

## 2020-03-27 ENCOUNTER — Encounter: Payer: Self-pay | Admitting: Family Medicine

## 2020-03-27 ENCOUNTER — Ambulatory Visit: Payer: 59 | Admitting: Family Medicine

## 2020-03-27 DIAGNOSIS — M25521 Pain in right elbow: Secondary | ICD-10-CM

## 2020-03-27 MED ORDER — OXYCODONE-ACETAMINOPHEN 5-325 MG PO TABS: 1.0000 | ORAL_TABLET | Freq: Four times a day (QID) | ORAL | 0 refills | Status: DC | PRN

## 2020-03-27 NOTE — Addendum Note (Signed)
Addended by: Hortencia Pilar on: 03/27/2020 03:41 PM   Modules accepted: Orders

## 2020-03-27 NOTE — Progress Notes (Signed)
Subjective: He is here for planned PRP injection for chronic right lateral epicondylitis.  Objective: Remains point tender at the common extensor tendon at the lateral epicondyle.  Procedure: Leukocyte rich PRP injection: After sterile prep with Betadine, injected 5 cc of PRP into the common extensor tendon using ultrasound to guide needle placement, using a 22-gauge needle.

## 2020-03-30 ENCOUNTER — Telehealth: Payer: Self-pay | Admitting: Family Medicine

## 2020-03-30 MED ORDER — TRAMADOL HCL 50 MG PO TABS: 50.0000 mg | ORAL_TABLET | Freq: Four times a day (QID) | ORAL | 0 refills | Status: DC | PRN

## 2020-03-30 NOTE — Telephone Encounter (Signed)
I called and advised the Raymond [name] (patient).

## 2020-03-30 NOTE — Telephone Encounter (Signed)
Patient's wife Raymond [name] called asked if patient can get a Rx called in for pain medication that the can take while working.  The number to contact [place] is [phone]

## 2020-03-30 NOTE — Telephone Encounter (Signed)
Tramadol sent.

## 2020-03-30 NOTE — Telephone Encounter (Signed)
Please advise 

## 2020-11-25 ENCOUNTER — Ambulatory Visit: Payer: 59 | Admitting: Cardiovascular Disease

## 2020-11-25 ENCOUNTER — Encounter: Payer: Self-pay | Admitting: Cardiovascular Disease

## 2020-11-25 ENCOUNTER — Other Ambulatory Visit: Payer: Self-pay

## 2020-11-25 VITALS — BP 136/82 | HR 78 | Ht 70.0 in | Wt 199.2 lb

## 2020-11-25 DIAGNOSIS — E7800 Pure hypercholesterolemia, unspecified: Secondary | ICD-10-CM | POA: Diagnosis not present

## 2020-11-25 DIAGNOSIS — R002 Palpitations: Secondary | ICD-10-CM

## 2020-11-25 DIAGNOSIS — F411 Generalized anxiety disorder: Secondary | ICD-10-CM | POA: Diagnosis not present

## 2020-11-25 NOTE — Patient Instructions (Signed)
Medication Instructions:  No changes *If you need a refill on your cardiac medications before your next appointment, please call your pharmacy*   Lab Work: None ordered If you have labs (blood work) drawn today and your tests are completely normal, you will receive your results only by: [facility] (if you have MyChart) OR A paper copy in the mail If you have any lab test that is abnormal or we need to change your treatment, we will call you to review the results.   Testing/Procedures: Dr. Sallyanne Kuster has ordered a CT coronary calcium score. This test is done at [address]. This is $99 out of pocket.   Coronary CalciumScan A coronary calcium scan is an imaging test used to look for deposits of calcium and other fatty materials (plaques) in the inner lining of the blood vessels of the heart (coronary arteries). These deposits of calcium and plaques can partly clog and narrow the coronary arteries without producing any symptoms or warning signs. This puts a person at risk for a heart attack. This test can detect these deposits before symptoms develop. Tell a health care provider about: Any allergies you have. All medicines you are taking, including vitamins, herbs, eye drops, creams, and over-the-counter medicines. Any problems you or family members have had with anesthetic medicines. Any blood disorders you have. Any surgeries you have had. Any medical conditions you have. Whether you are pregnant or may be pregnant. What are the risks? Generally, this is a safe procedure. However, problems may occur, including: Harm to a pregnant woman and her unborn baby. This test involves the use of radiation. Radiation exposure can be dangerous to a pregnant woman and her unborn baby. If you are pregnant, you generally should not have this procedure done. Slight increase in the risk of cancer. This is because of the radiation involved in the test. What happens before the  procedure? No preparation is needed for this procedure. What happens during the procedure? You will undress and remove any jewelry around your neck or chest. You will put on a hospital gown. Sticky electrodes will be placed on your chest. The electrodes will be connected to an electrocardiogram (ECG) machine to record a tracing of the electrical activity of your heart. A CT scanner will take pictures of your heart. During this time, you will be asked to lie still and hold your breath for 2-3 seconds while a picture of your heart is being taken. The procedure may vary among health care providers and hospitals. What happens after the procedure? You can get dressed. You can return to your normal activities. It is up to you to get the results of your test. Ask your health care provider, or the department that is doing the test, when your results will be ready. Summary A coronary calcium scan is an imaging test used to look for deposits of calcium and other fatty materials (plaques) in the inner lining of the blood vessels of the heart (coronary arteries). Generally, this is a safe procedure. Tell your health care provider if you are pregnant or may be pregnant. No preparation is needed for this procedure. A CT scanner will take pictures of your heart. You can return to your normal activities after the scan is done. This information is not intended to replace advice given to you by your health care provider. Make sure you discuss any questions you have with your health care provider. Document Released: 11/12/2007 Document Revised: 04/04/2016 Document Reviewed: 04/04/2016  Elsevier Interactive Patient Education  2017 [place]: At [facility], you and your health needs are our priority.  As part of our continuing mission to provide you with exceptional heart care, we have created designated Provider Care Teams.  These Care Teams include your primary Cardiologist (physician) and  Advanced Practice Providers (APPs -  Physician Assistants and Nurse Practitioners) who all work together to provide you with the care you need, when you need it.  We recommend signing up for the patient portal called "MyChart".  Sign up information is provided on this After Visit Summary.  MyChart is used to connect with patients for Virtual Visits (Telemedicine).  Patients are able to view lab/test results, encounter notes, upcoming appointments, etc.  Non-urgent messages can be sent to your provider as well.   To learn more about what you can do with MyChart, go to [email or web address].    Your next appointment:   Follow up as needed with Dr. Sallyanne Kuster

## 2020-11-25 NOTE — Progress Notes (Signed)
Cardiology Office Note:    Date:  11/27/2020   ID:  Melene Plan, DOB 11/01/1975, MRN [id number]  PCP:  Patient, No Pcp Per (Inactive)   [facility] Providers Cardiologist:  None     Referring MD: Redmond School, MD   Chief Complaint  Patient presents with   Irregular Heart Beat  KRISTEN BUSHWAY is a 45 y.o. male who is being seen today for the evaluation of rapid heart beat at the request of Redmond School, MD.   History of Present Illness:    ELIYOHU CLASS is a 45 y.o. male with a hx of moderate hypercholesterolemia, recently diagnosed obstructive sleep apnea, anxiety, situational hypertension, who presents with complaints of rapid heart rate and a fluttering sensation in his chest.  He does not have a history of true hypertension, diabetes mellitus, known CAD or PAD, previous stroke, confirmed arrhythmia.  He has always noticed that his heart rate picks up quickly with physical activity or with emotion.  He struggled with a generalized anxiety disorder for years and takes paroxetine.  More recently he has become aware of his rapid heart rates due to his smart watch (this is an optical, not an electrical sensor) and has subsequently purchased a Kardia device.  His smart watch will frequently show heart rates in the 100-120 bpm and this was particularly evident when he had a tooth/sinus infection for which she had to take antibiotics.  The heart rate has settled down now that the infection has resolved.  He has taken twice daily random samplings of his heart rhythm with the Kardia and these consistently show normal sinus rhythm.  He is not always aware of palpitations when he has a rapid heartbeat.  This is not associated with dizziness, presyncope, chest pain, dyspnea or diaphoresis.  His hemoglobin is 18.3 and he has recently been diagnosed with obstructive sleep apnea, although he does not have the typical daytime hypersomnolence complaints.  He is very physically active.  He  wakes up feeling that he did not get enough sleep, but he does not have to take naps during the day and does not fall asleep in inappropriate circumstances.  He can easily finish a  2-hour movie without nodding off.  He is very physically active and can perform intense activity without angina or dyspnea.  He is limited more by pain and locking of his left knee where he has had a meniscal tear than by anything else.  Past Medical History:  Diagnosis Date   Hyperlipidemia     Past Surgical History:  Procedure Laterality Date   Left knee surgery Left    Meniscal tear    Current Medications: Current Meds  Medication Sig   nitroGLYCERIN (NITRO-DUR) 0.1 mg/hr patch Apply 1/4 patch to affected area 12 hours daily   Omega-3 Fatty Acids (SV FISH OIL PO) Take by mouth daily.   PARoxetine (PAXIL) 10 MG tablet Take 5 mg by mouth daily.   Red Yeast Rice Extract (RED YEAST RICE PO) Take by mouth daily.     Allergies:   Patient has no known allergies.   Social History   Socioeconomic History   Marital status: Married    Spouse name: Not on file   Number of children: Not on file   Years of education: Not on file   Highest education level: Not on file  Occupational History   Not on file  Tobacco Use   Smoking status: Never   Smokeless tobacco: Never  Substance  and Sexual Activity   Alcohol use: Not Currently   Drug use: Not on file   Sexual activity: Not on file  Other Topics Concern   Not on file  Social History Narrative   Not on file   Social Determinants of Health   Financial Resource Strain: Not on file  Food Insecurity: Not on file  Transportation Needs: Not on file  Physical Activity: Not on file  Stress: Not on file  Social Connections: Not on file     Family History: The patient's family history includes Breast cancer in his mother; COPD in his paternal grandfather; Colon cancer in his maternal grandmother; Diabetes Mellitus II in his maternal grandfather, paternal  grandfather, and paternal grandmother.  ROS:   Please see the history of present illness.     All other systems reviewed and are negative.  EKGs/Labs/Other Studies Reviewed:    The following studies were reviewed today: Reviewed multiple tracings from the patient's home ECG monitor  EKG:  EKG is ordered today.  The ekg ordered today demonstrates normal sinus rhythm with sinus arrhythmia, completely normal tracing, QTC 437 ms  Recent Labs: No results found for requested labs within last 8760 hours.  Labs from PCP 09/09/2020 Hemoglobin 18.3, otherwise normal CBC Glucose 95, creatinine 1.02, normal electrolytes, bilirubin 1.6 otherwise normal LFTs, TSH 2.13. Recent Lipid Panel No results found for: CHOL, TRIG, HDL, CHOLHDL, VLDL, LDLCALC, LDLDIRECT Labs from PCP 09/09/2020 Cholesterol 217, HDL 40, LDL 151, triglycerides 143  Risk Assessment/Calculations:           Physical Exam:    VS:  BP 136/82   Pulse 78   Ht 5' 10"  (1.778 m)   Wt 199 lb 3.2 oz (90.4 kg)   SpO2 98%   BMI 28.58 kg/m     Wt Readings from Last 3 Encounters:  11/25/20 199 lb 3.2 oz (90.4 kg)     GEN: Appears fit, overweight, well nourished, well developed in no acute distress HEENT: Normal NECK: No JVD; No carotid bruits LYMPHATICS: No lymphadenopathy CARDIAC: RRR, no murmurs, rubs, gallops RESPIRATORY:  Clear to auscultation without rales, wheezing or rhonchi  ABDOMEN: Soft, non-tender, non-distended MUSCULOSKELETAL:  No edema; No deformity  SKIN: Warm and dry NEUROLOGIC:  Alert and oriented x 3 PSYCHIATRIC:  Normal affect   ASSESSMENT:    1. Palpitations   2. Hypercholesterolemia   3. Generalized anxiety disorder    PLAN:    In order of problems listed above:  Palpitations: No evidence of significant arrhythmia with his home ECG monitoring.  His symptoms do sound much more like sinus tachycardia than a true arrhythmia.  He is at low risk for significant arrhythmia such as atrial  fibrillation or ventricular tachycardia.  No further evaluation planned at this time. Hypercholesterolemia: He does not have known vascular disease or other coronary risk factors, but his LDL cholesterol is high enough to where we might consider treatment.  In addition he has borderline low HDL and borderline high triglycerides suggesting that he may have an unfavorable atherogenic small dense LDL pattern.  I suggested a coronary calcium score to fine-tune our recommendations for lipid-lowering therapy. Generalized anxiety disorder: Sounds like this is pretty well managed with paroxetine.  It may explain many of his episodes of tachycardia.        Medication Adjustments/Labs and Tests Ordered: Current medicines are reviewed at length with the patient today.  Concerns regarding medicines are outlined above.  Orders Placed This Encounter  Procedures  CT CARDIAC SCORING (SELF PAY ONLY)   EKG 12-Lead   No orders of the defined types were placed in this encounter.   Patient Instructions  Medication Instructions:  No changes *If you need a refill on your cardiac medications before your next appointment, please call your pharmacy*   Lab Work: None ordered If you have labs (blood work) drawn today and your tests are completely normal, you will receive your results only by: [place] (if you have MyChart) OR A paper copy in the mail If you have any lab test that is abnormal or we need to change your treatment, we will call you to review the results.   Testing/Procedures: Dr. Sallyanne Kuster has ordered a CT coronary calcium score. This test is done at [address]. This is $99 out of pocket.   Coronary CalciumScan A coronary calcium scan is an imaging test used to look for deposits of calcium and other fatty materials (plaques) in the inner lining of the blood vessels of the heart (coronary arteries). These deposits of calcium and plaques can partly clog and narrow the  coronary arteries without producing any symptoms or warning signs. This puts a person at risk for a heart attack. This test can detect these deposits before symptoms develop. Tell a health care provider about: Any allergies you have. All medicines you are taking, including vitamins, herbs, eye drops, creams, and over-the-counter medicines. Any problems you or family members have had with anesthetic medicines. Any blood disorders you have. Any surgeries you have had. Any medical conditions you have. Whether you are pregnant or may be pregnant. What are the risks? Generally, this is a safe procedure. However, problems may occur, including: Harm to a pregnant woman and her unborn baby. This test involves the use of radiation. Radiation exposure can be dangerous to a pregnant woman and her unborn baby. If you are pregnant, you generally should not have this procedure done. Slight increase in the risk of cancer. This is because of the radiation involved in the test. What happens before the procedure? No preparation is needed for this procedure. What happens during the procedure? You will undress and remove any jewelry around your neck or chest. You will put on a hospital gown. Sticky electrodes will be placed on your chest. The electrodes will be connected to an electrocardiogram (ECG) machine to record a tracing of the electrical activity of your heart. A CT scanner will take pictures of your heart. During this time, you will be asked to lie still and hold your breath for 2-3 seconds while a picture of your heart is being taken. The procedure may vary among health care providers and hospitals. What happens after the procedure? You can get dressed. You can return to your normal activities. It is up to you to get the results of your test. Ask your health care provider, or the department that is doing the test, when your results will be ready. Summary A coronary calcium scan is an imaging test  used to look for deposits of calcium and other fatty materials (plaques) in the inner lining of the blood vessels of the heart (coronary arteries). Generally, this is a safe procedure. Tell your health care provider if you are pregnant or may be pregnant. No preparation is needed for this procedure. A CT scanner will take pictures of your heart. You can return to your normal activities after the scan is done. This information is not intended to replace advice given  to you by your health care provider. Make sure you discuss any questions you have with your health care provider. Document Released: 11/12/2007 Document Revised: 04/04/2016 Document Reviewed: 04/04/2016 Elsevier Interactive Patient Education  2017 [place]: At [facility], you and your health needs are our priority.  As part of our continuing mission to provide you with exceptional heart care, we have created designated Provider Care Teams.  These Care Teams include your primary Cardiologist (physician) and Advanced Practice Providers (APPs -  Physician Assistants and Nurse Practitioners) who all work together to provide you with the care you need, when you need it.  We recommend signing up for the patient portal called "MyChart".  Sign up information is provided on this After Visit Summary.  MyChart is used to connect with patients for Virtual Visits (Telemedicine).  Patients are able to view lab/test results, encounter notes, upcoming appointments, etc.  Non-urgent messages can be sent to your provider as well.   To learn more about what you can do with MyChart, go to [email or web address].    Your next appointment:   Follow up as needed with Dr. Sallyanne Kuster   Signed, Sanda Klein, MD  11/27/2020 9:06 AM    [place]

## 2020-11-27 ENCOUNTER — Encounter: Payer: Self-pay | Admitting: Cardiovascular Disease

## 2020-11-27 NOTE — Progress Notes (Signed)
HopefullyI called Raymond [name] and told him that I may have inadvertently exposed him and Raymond [name] to COVID-19, as I tested positive for the illness the next morning.  We all wore masks during the encounter will provide some protection.  I asked them to pay attention to any symptoms of viral illness such as fever, cough, sore throat, shortness of breath, etc. and to get tested for COVID-19 if they should occur.  Also advised being particularly careful around the older members of the family.

## 2020-12-01 ENCOUNTER — Telehealth: Payer: Self-pay | Admitting: Cardiovascular Disease

## 2020-12-01 NOTE — Telephone Encounter (Signed)
Spoke with pt, he was unable to get a confirmation about the date for his CT scan, there was a bad phone connection. He has left a message but has not heard from anyone to schedule. Will call CT department.

## 2020-12-01 NOTE — Telephone Encounter (Signed)
Pt is calling to find out about his calcium scan

## 2020-12-01 NOTE — Telephone Encounter (Signed)
Spoke with pt, aware he is scheduled for CT July 28th at 1:30 pm. Location and self pay discussed with the patient.

## 2020-12-24 ENCOUNTER — Other Ambulatory Visit: Payer: Self-pay

## 2020-12-24 ENCOUNTER — Ambulatory Visit (INDEPENDENT_AMBULATORY_CARE_PROVIDER_SITE_OTHER)
Admission: RE | Admit: 2020-12-24 | Discharge: 2020-12-24 | Disposition: A | Discharge status: Home / Self Care | Payer: Self-pay | Source: Ambulatory Visit | Attending: Cardiovascular Disease | Admitting: Cardiovascular Disease

## 2020-12-24 DIAGNOSIS — R002 Palpitations: Secondary | ICD-10-CM

## 2020-12-27 DIAGNOSIS — E7800 Pure hypercholesterolemia, unspecified: Secondary | ICD-10-CM

## 2020-12-28 MED ORDER — ATORVASTATIN CALCIUM 20 MG PO TABS: 20.0000 mg | ORAL_TABLET | Freq: Every day | ORAL | 3 refills | Status: DC

## 2020-12-30 NOTE — Telephone Encounter (Signed)
Left a message for the patient to call back per his MyChart request.

## 2020-12-30 NOTE — Addendum Note (Signed)
Addended by: Ricci Barker on: 12/30/2020 03:01 PM   Modules accepted: Orders

## 2020-12-30 NOTE — Telephone Encounter (Signed)
Spoke with patient in detail about his calcium score test. He would like to know if/when he needs lab work done (fasting lipid since starting a statin) and when he should follow up with Dr. Sallyanne Kuster.

## 2021-03-17 NOTE — Telephone Encounter (Signed)
[facility] is calling requesting a callback from Lattie Haw in regards to these messages. Best callback is [phone].

## 2021-03-24 ENCOUNTER — Telehealth: Payer: Self-pay | Admitting: *Deleted

## 2021-03-24 DIAGNOSIS — E7800 Pure hypercholesterolemia, unspecified: Secondary | ICD-10-CM

## 2021-03-24 DIAGNOSIS — R002 Palpitations: Secondary | ICD-10-CM

## 2021-03-24 NOTE — Addendum Note (Signed)
Addended by: Ricci Barker on: 03/24/2021 03:46 PM   Modules accepted: Orders

## 2021-03-24 NOTE — Telephone Encounter (Signed)
Left a message for the patient to call back.  AC Bearl is calling requesting a callback from [place] in regards to these messages. Best callback is [phone].

## 2021-03-24 NOTE — Telephone Encounter (Signed)
The patient was calling to see if he could also get a cbc with his fasting lipid. This has been added.

## 2021-04-20 ENCOUNTER — Other Ambulatory Visit: Payer: Self-pay

## 2021-04-20 DIAGNOSIS — R002 Palpitations: Secondary | ICD-10-CM

## 2021-04-20 DIAGNOSIS — E7800 Pure hypercholesterolemia, unspecified: Secondary | ICD-10-CM

## 2021-04-20 LAB — CBC
Hematocrit: 50.4 % (ref 37.5–51.0)
Hemoglobin: 17.6 g/dL (ref 13.0–17.7)
MCH: 31.3 pg (ref 26.6–33.0)
MCHC: 34.9 g/dL (ref 31.5–35.7)
MCV: 90 fL (ref 79–97)
Platelets: 243 10*3/uL (ref 150–450)
RBC: 5.62 x10E6/uL (ref 4.14–5.80)
RDW: 12.2 % (ref 11.6–15.4)
WBC: 5.8 10*3/uL (ref 3.4–10.8)

## 2021-04-20 LAB — LIPID PANEL
Chol/HDL Ratio: 4.1 ratio (ref 0.0–5.0)
Cholesterol, Total: 140 mg/dL (ref 100–199)
HDL: 34 mg/dL — ABNORMAL LOW (ref >39)
LDL Chol Calc (NIH): 79 mg/dL (ref 0–99)
Triglycerides: 152 mg/dL — ABNORMAL HIGH (ref 0–149)
VLDL Cholesterol Cal: 27 mg/dL (ref 5–40)

## 2022-01-15 ENCOUNTER — Other Ambulatory Visit: Payer: Self-pay | Admitting: Cardiovascular Disease

## 2022-04-20 ENCOUNTER — Other Ambulatory Visit: Payer: Self-pay | Admitting: Cardiovascular Disease

## 2022-05-17 ENCOUNTER — Other Ambulatory Visit: Payer: Self-pay | Admitting: Cardiovascular Disease

## 2022-06-08 ENCOUNTER — Other Ambulatory Visit: Payer: Self-pay | Admitting: Cardiovascular Disease

## 2022-08-15 ENCOUNTER — Telehealth: Payer: Self-pay | Admitting: Cardiovascular Disease

## 2022-08-15 NOTE — Telephone Encounter (Signed)
Patient c/o Palpitations:  High priority if patient c/o lightheadedness, shortness of breath, or chest pain  How long have you had palpitations/irregular HR/ Afib? Are you having the symptoms now? Been in Afib 1 time   Are you currently experiencing lightheadedness, SOB or CP? Some chest pain and tightness off and on, but not at this tome, heart rate gets fast sometimes  Do you have a history of afib (atrial fibrillation) or irregular heart rhythm?   Have you checked your BP or HR? (document readings if available):   Are you experiencing any other symptoms? No other symptoms- patient wants to be seen, nothing available until April-

## 2022-08-15 NOTE — Telephone Encounter (Signed)
Called patient, LVM to call back.  Left call back number.   

## 2022-08-16 NOTE — Telephone Encounter (Signed)
Spoke to patient  @ 8:31 am this morning.  Patient returning call.  Patient states  for the last 2 weeks or so he has been having some fluttering sensation .  Patient states he had the issue happen last year.  The sensation fluttering /palp.has come back little frequent. Patient states  he di decrease caffeine intake.   He did purchase  a kardiamoible as recommended by Dr Sallyanne Kuster at last visit.   Patien staes she is still able to do his walks ( as he states he walking now)  blood pressure range 118/70   heart rate69 now and not irregular  He states he does get nervous and little anxious.  He states  mostly when he looks at [employer] mobile  the strips had been Sinus rhythm  except yesterday reading showed possible afib , but today's reading was sinus.  RN reassured patient  that he is able to all of activity without  any problems - that afib is a  condition is manageable.    RN request patient  to upload strips so Dr Sallyanne Kuster  may review.  Once reviewed will contact patientPatient states he wil be able to the upload. .  Patient has an upcoming appt 09/05/22 to discuss symptom that was made yesterday from  telephone call.  Patient voiced understanding

## 2022-08-16 NOTE — Telephone Encounter (Signed)
Reviewed all 4 tracings from his home monitoring device.  I do not think any of them show atrial fibrillation.  The one labeled "possible atrial fibrillation" has a lot of motion artifact, but I am quite confident that it actually shows normal sinus rhythm.

## 2022-08-19 NOTE — Telephone Encounter (Signed)
Left message that Dr C had reviewed strips. Left you message via mychart.

## 2022-09-16 ENCOUNTER — Encounter: Payer: Self-pay | Admitting: Nurse Practitioner

## 2022-09-16 ENCOUNTER — Ambulatory Visit: Payer: 59 | Attending: Nurse Practitioner | Admitting: Nurse Practitioner

## 2022-09-16 VITALS — BP 132/74 | HR 75 | Ht 70.0 in | Wt 198.0 lb

## 2022-09-16 DIAGNOSIS — E7800 Pure hypercholesterolemia, unspecified: Secondary | ICD-10-CM

## 2022-09-16 DIAGNOSIS — R002 Palpitations: Secondary | ICD-10-CM | POA: Diagnosis not present

## 2022-09-16 DIAGNOSIS — F411 Generalized anxiety disorder: Secondary | ICD-10-CM | POA: Diagnosis not present

## 2022-09-16 DIAGNOSIS — G4733 Obstructive sleep apnea (adult) (pediatric): Secondary | ICD-10-CM

## 2022-09-16 NOTE — Patient Instructions (Signed)
Medication Instructions:  Your physician recommends that you continue on your current medications as directed. Please refer to the Current Medication list given to you today.   *If you need a refill on your cardiac medications before your next appointment, please call your pharmacy*   Lab Work: NONE ordered at this time of appointment   If you have labs (blood work) drawn today and your tests are completely normal, you will receive your results only by: MyChart Message (if you have MyChart) OR A paper copy in the mail If you have any lab test that is abnormal or we need to change your treatment, we will call you to review the results.   Testing/Procedures: NONE ordered at this time of appointment     Follow-Up: At [facility], you and your health needs are our priority.  As part of our continuing mission to provide you with exceptional heart care, we have created designated Provider Care Teams.  These Care Teams include your primary Cardiologist (physician) and Advanced Practice Providers (APPs -  Physician Assistants and Nurse Practitioners) who all work together to provide you with the care you need, when you need it.  We recommend signing up for the patient portal called "MyChart".  Sign up information is provided on this After Visit Summary.  MyChart is used to connect with patients for Virtual Visits (Telemedicine).  Patients are able to view lab/test results, encounter notes, upcoming appointments, etc.  Non-urgent messages can be sent to your provider as well.   To learn more about what you can do with MyChart, go to [email or web address].    Your next appointment:   6 month(s)  Provider:   Mihai Croitoru, MD     Other Instructions   

## 2022-09-16 NOTE — Progress Notes (Unsigned)
Office Visit    Patient Name: Raymond [name] Date of Encounter: 09/16/2022  Primary Care Provider:  Patient, No Pcp Per Primary Cardiologist:  Thurmon Fair, MD  Chief Complaint    47 year old male with a history of palpitations, hyperlipidemia, OSA and anxiety who presents for follow-up related to palpitations.  Past Medical History    Past Medical History:  Diagnosis Date   Hyperlipidemia    Past Surgical History:  Procedure Laterality Date   Left knee surgery Left    Meniscal tear    Allergies  No Known Allergies   Labs/Other Studies Reviewed    The following studies were reviewed today: CT cardiac scoring 11/2020: IMPRESSION: 1. Coronary calcium score of 0. This was 1st percentile for age, gender, and race matched controls.   2. Aortic atherosclerosis noted.    Recent Labs: No results found for requested labs within last 365 days.  Recent Lipid Panel    Component Value Date/Time   CHOL 140 04/20/2021 0838   TRIG 152 (H) 04/20/2021 0838   HDL 34 (L) 04/20/2021 0838   CHOLHDL 4.1 04/20/2021 0838   LDLCALC 79 04/20/2021 0838    History of Present Illness    47 year old male with the above past medical history including palpitations, hyperlipidemia, OSA, and anxiety.  He has a history of elevated heart rate with physical activity or emotion, no associated symptoms. He has monitored his heart rate consistently at home using a Kardia mobile device, tracings have revealed NSR.  He was last seen in the office on 11/25/2020 and was stable from a cardiac standpoint.  He noted ongoing intermittent palpitations with no evidence of significant arrhythmia.  Coronary calcium scoring revealed coronary calcium score of 0 (1st percentile), aortic atherosclerosis.  He was started on Crestor 10 mg daily.  He contacted our office on 08/15/2022 with concern for possible atrial fibrillation on his current mobile device. Rhythm strips were reviewed by Dr. Royann Shivers and revealed  sinus rhythm with artifact, no evidence of atrial fibrillation.  He presents today for follow-up.  Since his last visit he has been stable from a cardiac standpoint.  He does note ongoing intermittent palpitations, however, they have improved significantly with increased Paxil.  He notes occasional associated chest tightness.  He denies symptoms concerning for angina.  He walks approximately 2 miles fairly regularly, denies any exertional symptoms.    Palpitations: He has a history of palpitations which he describes as a fluttering in his chest he will occasionally note associated chest tightness associated with his palpitations.  These tend to occur in the setting of anxiety and cause even more anxiety.  He started taking his Paxil every day (was taking it every other day previously) and since this time he has noticed an improvement in his palpitations.  He monitors his heart rate/rhythm with a cardia mobile device.  Readings have shown sinus rhythm consistently, no evidence of significant arrhythmia.  Discussed ED precautions, triggers, anxiety management, options for medical therapy.  Through shared decision making, will continue to monitor and defer any further monitoring or treatments at this time. Hyperlipidemia: Coronary calcium scoring revealed coronary calcium score of 0 (1st percentile), aortic atherosclerosis.  LDL was 60 in 08/2022.  Continue atorvastatin. OSA: Parent to CPAP. Anxiety: Managed per PCP. Disposition: Follow-up in 6 months.  Home Medications    Current Outpatient Medications  Medication Sig Dispense Refill   atorvastatin (LIPITOR) 20 MG tablet Take 1 tablet (20 mg total) by mouth daily. 90 tablet 1  PARoxetine (PAXIL) 10 MG tablet Take 5 mg by mouth daily.     XYZAL ALLERGY 24HR 5 MG tablet Take 5 mg by mouth daily at 6 (six) AM.     No current facility-administered medications for this visit.     Review of Systems    He denies chest pain, palpitations, dyspnea, pnd,  orthopnea, n, v, dizziness, syncope, edema, weight gain, or early satiety. All other systems reviewed and are otherwise negative except as noted above.   Physical Exam    VS:  BP 132/74   Pulse 75   Ht  (1.778 m)   Wt 198 lb (89.8 kg)   SpO2 98%   BMI 28.41 kg/m  GEN: Well nourished, well developed, in no acute distress. HEENT: normal. Neck: Supple, no JVD, carotid bruits, or masses. Cardiac: RRR, no murmurs, rubs, or gallops. No clubbing, cyanosis, edema.  Radials/DP/PT 2+ and equal bilaterally.  Respiratory:  Respirations regular and unlabored, clear to auscultation bilaterally. GI: Soft, nontender, nondistended, BS + x 4. MS: no deformity or atrophy. Skin: warm and dry, no rash. Neuro:  Strength and sensation are intact. Psych: Normal affect.  Accessory Clinical Findings    ECG personally reviewed by me today -NSR, 75 bpm, sinus arrhythmia- no acute changes.   Lab Results  Component Value Date   WBC 5.8 04/20/2021   HGB 17.6 04/20/2021   HCT 50.4 04/20/2021   MCV 90 04/20/2021   PLT 243 04/20/2021   No results found for: "CREATININE", "BUN", "NA", "K", "CL", "CO2" No results found for: "ALT", "AST", "GGT", "ALKPHOS", "BILITOT" Lab Results  Component Value Date   CHOL 140 04/20/2021   HDL 34 (L) 04/20/2021   LDLCALC 79 04/20/2021   TRIG 152 (H) 04/20/2021   CHOLHDL 4.1 04/20/2021    No results found for: "HGBA1C"  Assessment & Plan    1.  ***      Joylene Grapes, NP 09/16/2022, 8:22 AM

## 2022-09-18 ENCOUNTER — Encounter: Payer: Self-pay | Admitting: Nurse Practitioner

## 2023-01-04 ENCOUNTER — Other Ambulatory Visit: Payer: Self-pay | Admitting: Cardiovascular Disease

## 2023-02-03 ENCOUNTER — Encounter: Payer: Self-pay | Admitting: Physician Assistant

## 2023-02-03 ENCOUNTER — Ambulatory Visit (INDEPENDENT_AMBULATORY_CARE_PROVIDER_SITE_OTHER): Payer: 59 | Admitting: Physician Assistant

## 2023-02-03 ENCOUNTER — Other Ambulatory Visit (INDEPENDENT_AMBULATORY_CARE_PROVIDER_SITE_OTHER): Payer: 59

## 2023-02-03 DIAGNOSIS — M25511 Pain in right shoulder: Secondary | ICD-10-CM | POA: Diagnosis not present

## 2023-02-03 MED ORDER — LIDOCAINE HCL 1 % IJ SOLN
2.0000 mL | INTRAMUSCULAR | Status: AC | PRN
  Administered 2023-02-03: 2 mL

## 2023-02-03 MED ORDER — BUPIVACAINE HCL 0.25 % IJ SOLN
2.0000 mL | INTRAMUSCULAR | Status: AC | PRN
  Administered 2023-02-03: 2 mL via INTRA_ARTICULAR

## 2023-02-03 MED ORDER — METHYLPREDNISOLONE ACETATE 40 MG/ML IJ SUSP
80.0000 mg | INTRAMUSCULAR | Status: AC | PRN
  Administered 2023-02-03: 80 mg via INTRA_ARTICULAR

## 2023-02-03 NOTE — Progress Notes (Signed)
Office Visit Note   Patient: Raymond [name]           Date of Birth: 1975/08/06           MRN: [id number] Visit Date: 02/03/2023              Requested by: No referring provider defined for this encounter. PCP: Patient, No Pcp Per   Assessment & Plan: Visit Diagnoses:  1. Acute pain of right shoulder     Plan: Pleasant 47 year old gentleman with a 2-day history of right shoulder pain especially with internal rotation behind his back after doing some pulling and carrying with heavy objects at his home.  Has taken Advil.  Rates his pain to mild to moderate.  He has had subacromial injections in the past which seem to help him.  His rotator cuff strength is quite good he does not have any radiculopathy and no pain in his neck.  May have just been some irritation to the rotator cuff.  Offered him an injection today also anti-inflammatories.  He would like to try the injection will follow-up as needed  Follow-Up Instructions: No follow-ups on file.   Orders:  Orders Placed This Encounter  Procedures   XR Shoulder Right   No orders of the defined types were placed in this encounter.     Procedures: Large Joint Inj: R subacromial bursa on 02/03/2023 10:19 AM Indications: diagnostic evaluation and pain Details: 25 G 1.5 in needle, posterior approach  Arthrogram: No  Medications: 2 mL lidocaine 1 %; 80 mg methylPREDNISolone acetate 40 MG/ML; 2 mL bupivacaine 0.25 % Outcome: tolerated well, no immediate complications Procedure, treatment alternatives, risks and benefits explained, specific risks discussed. Consent was given by the patient.       Clinical Data: No additional findings.   Subjective: No chief complaint on file.   HPI patient is a pleasant 47 year old gentleman previous patient of Dr. Prince Rome.  He has had some impingement in his right shoulder in the past.  Last was injected a little over a year ago.  He now has the return of right shoulder pain.  He thinks this  was secondary to pulling something and lifting some things a few days ago rates his pain is moderate  Review of Systems  All other systems reviewed and are negative.    Objective: Vital Signs: There were no vitals taken for this visit.  Physical Exam Constitutional:      Appearance: Normal appearance.  Pulmonary:     Effort: Pulmonary effort is normal.  Skin:    General: Skin is warm and dry.  Neurological:     General: No focal deficit present.     Mental Status: He is alert and oriented to person, place, and time.     Ortho Exam Examination of his right shoulder no step-off no deformity no redness.  He has excellent strength with resisted abduction external and internal rotation.  No tenderness with internal or external rotation.  He has a negative empty can test does have pain with internal rotation behind his back and with motion above his head speeds test is negative he is neurovascular intact Specialty Comments:  No specialty comments available.  Imaging: No results found.   PMFS History: Patient Active Problem List   Diagnosis Date Noted   Pain in right shoulder 02/03/2023   S/P left knee arthroscopy 02/14/2017   Chronic pain of left knee 12/29/2016   Past Medical History:  Diagnosis Date   Hyperlipidemia  Family History  Problem Relation Age of Onset   Breast cancer Mother    Colon cancer Maternal Grandmother    Diabetes Mellitus II Maternal Grandfather    Diabetes Mellitus II Paternal Grandmother    Diabetes Mellitus II Paternal Grandfather    COPD Paternal Grandfather     Past Surgical History:  Procedure Laterality Date   Left knee surgery Left    Meniscal tear   Social History   Occupational History   Not on file  Tobacco Use   Smoking status: Never   Smokeless tobacco: Never  Substance and Sexual Activity   Alcohol use: Not Currently   Drug use: Not on file   Sexual activity: Not on file

## 2023-04-06 ENCOUNTER — Ambulatory Visit: Payer: 59 | Attending: Nurse Practitioner | Admitting: Nurse Practitioner

## 2023-04-06 ENCOUNTER — Encounter: Payer: Self-pay | Admitting: Nurse Practitioner

## 2023-04-06 VITALS — BP 134/80 | HR 76 | Ht 70.0 in | Wt 208.8 lb

## 2023-04-06 DIAGNOSIS — R002 Palpitations: Secondary | ICD-10-CM

## 2023-04-06 DIAGNOSIS — G4733 Obstructive sleep apnea (adult) (pediatric): Secondary | ICD-10-CM | POA: Diagnosis not present

## 2023-04-06 DIAGNOSIS — F411 Generalized anxiety disorder: Secondary | ICD-10-CM | POA: Diagnosis not present

## 2023-04-06 DIAGNOSIS — E782 Mixed hyperlipidemia: Secondary | ICD-10-CM

## 2023-04-06 NOTE — Progress Notes (Signed)
Office Visit    Patient Name: Raymond [name] Date of Encounter: 04/06/2023  Primary Care Provider:  Patient, No Pcp Per Primary Cardiologist:  Thurmon Fair, MD  Chief Complaint    47 year old male with a history of palpitations, hyperlipidemia, OSA and anxiety who presents for follow-up related to palpitations.   Past Medical History    Past Medical History:  Diagnosis Date   Hyperlipidemia    Past Surgical History:  Procedure Laterality Date   Left knee surgery Left    Meniscal tear    Allergies  No Known Allergies   Labs/Other Studies Reviewed    The following studies were reviewed today:  Cardiac Studies & Procedures          CT SCANS  CT CARDIAC SCORING (SELF PAY ONLY) 12/24/2020  Addendum 01/13/2021  3:49 PM ADDENDUM REPORT: 01/13/2021 15:47  EXAM: OVER-READ INTERPRETATION  CT CHEST  The following report is an over-read performed by radiologist Dr. Jeronimo Greaves of [facility] on 01/13/2021. This over-read does not include interpretation of cardiac or coronary anatomy or pathology. The calcium score interpretation by the cardiologist is attached.  COMPARISON:  None.  FINDINGS: Vascular: Aortic atherosclerosis.  Mediastinum/Nodes: No imaged thoracic adenopathy.  Lungs/Pleura: No pleural fluid. Mild subsegmental atelectasis in the superior segment right lower lobe.  Upper Abdomen: Mild hepatic steatosis. Normal imaged portions of the spleen, stomach.  Musculoskeletal: No acute osseous abnormality.  IMPRESSION: 1.  No acute findings in the imaged extracardiac chest. 2.  Aortic Atherosclerosis (ICD10-I70.0). 3. Mild hepatic steatosis.   Electronically Signed By: Jeronimo Greaves M.D. On: 01/13/2021 15:47  Narrative CLINICAL DATA:  Risk stratification: 47 Year-old White Male  EXAM: Coronary Calcium Score  TECHNIQUE: The patient was scanned on a [employer]. Axial non-contrast 3 mm slices were carried out through  the heart. The data set was analyzed on a dedicated work station and scored using the Agatson method.  FINDINGS: Non-cardiac: See separate report from [facility].  Ascending Aorta: Aortic atherosclerosis noted.  Pericardium: Normal.  Coronary arteries: Normal origins.  Coronary Calcium Score:  Left main: 0  Left anterior descending artery: 0  Left circumflex artery: 0  Right coronary artery: 0  Total: 0  Percentile: 1st for age, sex, and race matched control.  IMPRESSION: 1. Coronary calcium score of 0. This was 1st percentile for age, gender, and race matched controls.  2. Aortic atherosclerosis noted.  RECOMMENDATIONS:  Coronary artery calcium (CAC) score is a strong predictor of incident coronary heart disease (CHD) and provides predictive information beyond traditional risk factors. CAC scoring is reasonable to use in the decision to withhold, postpone, or initiate statin therapy in intermediate-risk or selected borderline-risk asymptomatic adults (age 75-75 years and LDL-C >=70 to <190 mg/dL) who do not have diabetes or established atherosclerotic cardiovascular disease (ASCVD).* In intermediate-risk (10-year ASCVD risk >=7.5% to <20%) adults or selected borderline-risk (10-year ASCVD risk >=5% to <7.5%) adults in whom a CAC score is measured for the purpose of making a treatment decision the following recommendations have been made:  If CAC = 0, it is reasonable to withhold statin therapy and reassess in 5 to 10 years, as long as higher risk conditions are absent (diabetes mellitus, family history of premature CHD in first degree relatives (males <55 years; females <65 years), cigarette smoking, LDL >=190 mg/dL or other independent risk factors).  If CAC is 1 to 99, it is reasonable to initiate statin therapy for patients >=91 years of age.  If CAC is >=100 or >=75th percentile, it is reasonable to initiate statin therapy at any  age.  Cardiology referral should be considered for patients with CAC scores =400 or >=75th percentile.  *2018 AHA/ACC/AACVPR/AAPA/ABC/ACPM/ADA/AGS/APhA/ASPC/NLA/PCNA Guideline on the Management of Blood Cholesterol: A Report of the American College of Cardiology/American Heart Association Task Force on Clinical Practice Guidelines. J Am Coll Cardiol. 2019;73(24):3168-3209.  Riley Lam, MD  Electronically Signed: By: Riley Lam MD On: 12/24/2020 15:52         Recent Labs: No results found for requested labs within last 365 days.  Recent Lipid Panel    Component Value Date/Time   CHOL 140 04/20/2021 0838   TRIG 152 (H) 04/20/2021 0838   HDL 34 (L) 04/20/2021 0838   CHOLHDL 4.1 04/20/2021 0838   LDLCALC 79 04/20/2021 0838    History of Present Illness    47 year old male with the above past medical history including palpitations, hyperlipidemia, OSA, and anxiety.   He has a history of elevated heart rate with physical activity or emotion, no associated symptoms. He has monitored his heart rate consistently at home using a Kardia mobile device, tracings have consistently revealed NSR. Coronary calcium scoring in 11/2020 revealed coronary calcium score of 0 (1st percentile), aortic atherosclerosis.  He was started on statin therapy.  He contacted our office on 08/15/2022 with concern for "possible atrial fibrillation" on his Kardia mobile device. Rhythm strips were reviewed by Dr. Royann Shivers and revealed sinus rhythm with artifact, no evidence of atrial fibrillation. He was last seen in the office on 09/16/2022 and was stable from a cardiac standpoint. He noticed improvement in his palpitations with increased Paxil dosing.    He presents today for follow-up.  Since his last visit he has done well from a cardiac standpoint.  He will note rare fleeting chest discomfort when walking up a hill, denies any other symptoms concerning for angina.  He also notes rare fleeting  palpitations, significantly improved, denies any associated symptoms.  He reports adherence to his CPAP machine. Overall, he reports feeling well.  Home Medications    Current Outpatient Medications  Medication Sig Dispense Refill   atorvastatin (LIPITOR) 20 MG tablet TAKE 1 TABLET BY MOUTH EVERY DAY 30 tablet 3   PARoxetine (PAXIL) 10 MG tablet Take 5 mg by mouth daily.     XYZAL ALLERGY 24HR 5 MG tablet Take 5 mg by mouth daily at 6 (six) AM.     No current facility-administered medications for this visit.     Review of Systems    He denies chest pain, dyspnea, pnd, orthopnea, n, v, dizziness, syncope, edema, weight gain, or early satiety. All other systems reviewed and are otherwise negative except as noted above.   Physical Exam    VS:  BP 134/80 (BP Location: Left Arm, Patient Position: Sitting, Cuff Size: Large)   Pulse 76   Ht 5\' 10"  (1.778 m)   Wt 208 lb 12.8 oz (94.7 kg)   SpO2 98%   BMI 29.96 kg/m   GEN: Well nourished, well developed, in no acute distress. HEENT: normal. Neck: Supple, no JVD, carotid bruits, or masses. Cardiac: RRR, no murmurs, rubs, or gallops. No clubbing, cyanosis, edema.  Radials/DP/PT 2+ and equal bilaterally.  Respiratory:  Respirations regular and unlabored, clear to auscultation bilaterally. GI: Soft, nontender, nondistended, BS + x 4. MS: no deformity or atrophy. Skin: warm and dry, no rash. Neuro:  Strength and sensation are intact. Psych: Normal affect.  Accessory Clinical  Findings    ECG personally reviewed by me today -    - no EKG in office today.    Lab Results  Component Value Date   WBC 5.8 04/20/2021   HGB 17.6 04/20/2021   HCT 50.4 04/20/2021   MCV 90 04/20/2021   PLT 243 04/20/2021   No results found for: "CREATININE", "BUN", "NA", "K", "CL", "CO2" No results found for: "ALT", "AST", "GGT", "ALKPHOS", "BILITOT" Lab Results  Component Value Date   CHOL 140 04/20/2021   HDL 34 (L) 04/20/2021   LDLCALC 79 04/20/2021    TRIG 152 (H) 04/20/2021   CHOLHDL 4.1 04/20/2021    No results found for: "HGBA1C"  Assessment & Plan    1. Palpitations: He has a history of palpitations which he describes as a fluttering in his chest, occasionally notes associated chest tightness. These tend to occur in the setting of anxiety. He monitors his heart rate/rhythm with a Kardia mobile device.  Readings have shown sinus rhythm consistently, no evidence of significant arrhythmia.  Symptoms have improved with increased Paxil dosing.  Discussed ED precautions, triggers, anxiety management. Continue to monitor symptoms.   2. Hyperlipidemia/chest discomfort: Coronary calcium scoring revealed coronary calcium score of 0 (1st percentile), aortic atherosclerosis. He notes rare fleeting chest discomfort when climbing up hills, denies any other symptoms concerning for angina.  Continue to monitor symptoms.  LDL was 60 in 08/2022.  Continue atorvastatin.   3. OSA: Adherent to CPAP.   4. Anxiety: Improved with increased Paxil dosing. Managed per PCP.   5. Disposition: Follow-up in 6 months, sooner if needed.       Joylene Grapes, NP 04/06/2023, 9:14 AM

## 2023-04-06 NOTE — Patient Instructions (Signed)
Medication Instructions:  Your physician recommends that you continue on your current medications as directed. Please refer to the Current Medication list given to you today.  *If you need a refill on your cardiac medications before your next appointment, please call your pharmacy*   Lab Work: NONE ordered at this time of appointment     Testing/Procedures: NONE ordered at this time of appointment     Follow-Up: At [facility], you and your health needs are our priority.  As part of our continuing mission to provide you with exceptional heart care, we have created designated Provider Care Teams.  These Care Teams include your primary Cardiologist (physician) and Advanced Practice Providers (APPs -  Physician Assistants and Nurse Practitioners) who all work together to provide you with the care you need, when you need it.  We recommend signing up for the patient portal called "MyChart".  Sign up information is provided on this After Visit Summary.  MyChart is used to connect with patients for Virtual Visits (Telemedicine).  Patients are able to view lab/test results, encounter notes, upcoming appointments, etc.  Non-urgent messages can be sent to your provider as well.   To learn more about what you can do with MyChart, go to [email or web address].    Your next appointment:   6 month(s)  Provider:   Thurmon Fair, MD

## 2023-05-03 ENCOUNTER — Other Ambulatory Visit: Payer: Self-pay | Admitting: Cardiovascular Disease

## 2023-06-21 ENCOUNTER — Other Ambulatory Visit (HOSPITAL_COMMUNITY): Payer: Self-pay | Admitting: Family Medicine

## 2023-06-21 DIAGNOSIS — R221 Localized swelling, mass and lump, neck: Secondary | ICD-10-CM

## 2023-06-27 ENCOUNTER — Ambulatory Visit (HOSPITAL_COMMUNITY)
Admission: RE | Admit: 2023-06-27 | Discharge: 2023-06-27 | Disposition: A | Discharge status: Home / Self Care | Payer: 59 | Source: Ambulatory Visit | Attending: Family Medicine | Admitting: Family Medicine

## 2023-06-27 DIAGNOSIS — R221 Localized swelling, mass and lump, neck: Secondary | ICD-10-CM | POA: Insufficient documentation

## 2023-08-24 ENCOUNTER — Institutional Professional Consult (permissible substitution) (INDEPENDENT_AMBULATORY_CARE_PROVIDER_SITE_OTHER): Payer: 59

## 2024-05-28 ENCOUNTER — Other Ambulatory Visit: Payer: Self-pay | Admitting: Cardiovascular Disease

## 2024-06-22 ENCOUNTER — Other Ambulatory Visit: Payer: Self-pay | Admitting: Cardiovascular Disease
